# Patient Record
Sex: Female | Born: 1955 | Race: White | Hispanic: No | Marital: Married | Smoking: Never smoker
Health system: Southern US, Community
[De-identification: ages and names within clinical notes are randomized; demographics above are authoritative.]

## PROBLEM LIST (undated history)

## (undated) DIAGNOSIS — Z8489 Family history of other specified conditions: Secondary | ICD-10-CM

## (undated) DIAGNOSIS — K8000 Calculus of gallbladder with acute cholecystitis without obstruction: Secondary | ICD-10-CM

## (undated) DIAGNOSIS — R42 Dizziness and giddiness: Secondary | ICD-10-CM

## (undated) DIAGNOSIS — R21 Rash and other nonspecific skin eruption: Secondary | ICD-10-CM

## (undated) HISTORY — PX: BREAST LUMPECTOMY: SHX2

## (undated) HISTORY — PX: BUNIONECTOMY: SHX129

## (undated) HISTORY — DX: Dizziness and giddiness: R42

---

## 1998-12-08 ENCOUNTER — Other Ambulatory Visit: Admission: RE | Admit: 1998-12-08 | Discharge: 1998-12-08 | Payer: Self-pay | Admitting: *Deleted

## 1999-12-09 ENCOUNTER — Encounter: Admission: RE | Admit: 1999-12-09 | Discharge: 1999-12-09 | Payer: Self-pay | Admitting: *Deleted

## 1999-12-09 ENCOUNTER — Encounter: Payer: Self-pay | Admitting: *Deleted

## 2000-04-27 ENCOUNTER — Encounter: Admission: RE | Admit: 2000-04-27 | Discharge: 2000-04-27 | Payer: Self-pay | Admitting: Internal Medicine

## 2000-04-27 ENCOUNTER — Encounter: Payer: Self-pay | Admitting: Internal Medicine

## 2001-10-23 ENCOUNTER — Other Ambulatory Visit: Admission: RE | Admit: 2001-10-23 | Discharge: 2001-10-23 | Payer: Self-pay | Admitting: Obstetrics and Gynecology

## 2002-06-25 ENCOUNTER — Encounter: Admission: RE | Admit: 2002-06-25 | Discharge: 2002-06-25 | Payer: Self-pay | Admitting: Internal Medicine

## 2002-06-25 ENCOUNTER — Encounter: Payer: Self-pay | Admitting: Internal Medicine

## 2002-10-16 ENCOUNTER — Other Ambulatory Visit: Admission: RE | Admit: 2002-10-16 | Discharge: 2002-10-16 | Payer: Self-pay | Admitting: Obstetrics and Gynecology

## 2002-10-16 ENCOUNTER — Encounter: Payer: Self-pay | Admitting: Obstetrics and Gynecology

## 2002-10-16 ENCOUNTER — Encounter: Admission: RE | Admit: 2002-10-16 | Discharge: 2002-10-16 | Payer: Self-pay | Admitting: Obstetrics and Gynecology

## 2003-11-21 ENCOUNTER — Encounter: Admission: RE | Admit: 2003-11-21 | Discharge: 2003-11-21 | Payer: Self-pay | Admitting: Obstetrics and Gynecology

## 2004-06-04 ENCOUNTER — Encounter: Admission: RE | Admit: 2004-06-04 | Discharge: 2004-06-04 | Payer: Self-pay | Admitting: Obstetrics and Gynecology

## 2004-06-26 ENCOUNTER — Encounter (INDEPENDENT_AMBULATORY_CARE_PROVIDER_SITE_OTHER): Payer: Self-pay | Admitting: *Deleted

## 2004-06-26 ENCOUNTER — Ambulatory Visit (HOSPITAL_BASED_OUTPATIENT_CLINIC_OR_DEPARTMENT_OTHER): Admission: RE | Admit: 2004-06-26 | Discharge: 2004-06-26 | Payer: Self-pay | Admitting: Surgery

## 2004-06-26 ENCOUNTER — Ambulatory Visit (HOSPITAL_COMMUNITY): Admission: RE | Admit: 2004-06-26 | Discharge: 2004-06-26 | Payer: Self-pay | Admitting: Surgery

## 2004-11-24 ENCOUNTER — Encounter: Admission: RE | Admit: 2004-11-24 | Discharge: 2004-11-24 | Payer: Self-pay | Admitting: Obstetrics and Gynecology

## 2005-11-26 ENCOUNTER — Encounter: Admission: RE | Admit: 2005-11-26 | Discharge: 2005-11-26 | Payer: Self-pay | Admitting: Obstetrics and Gynecology

## 2006-02-12 ENCOUNTER — Emergency Department (HOSPITAL_COMMUNITY)
Admission: EM | Admit: 2006-02-12 | Discharge: 2006-02-12 | Payer: BLUE CROSS/BLUE SHIELD | Admitting: Emergency Medicine

## 2007-01-03 ENCOUNTER — Encounter: Admission: RE | Admit: 2007-01-03 | Discharge: 2007-01-03 | Payer: Self-pay | Admitting: Obstetrics and Gynecology

## 2010-07-03 NOTE — Op Note (Signed)
[name]                   ACCOUNT NO.:  [id number]   MEDICAL RECORD NO.:  [id number]          PATIENT TYPE:  AMB   LOCATION:  DSC                          FACILITY:  [facility]   PHYSICIAN:  Currie Paris, M.D.DATE OF BIRTH:  1955-06-04   DATE OF PROCEDURE:  06/26/2004  DATE OF DISCHARGE:                                 OPERATIVE REPORT   OFFICE MEDICAL RECORD NUMBER:  [id number]   PREOPERATIVE DIAGNOSIS:  Right breast mass, likely benign.   POSTOPERATIVE DIAGNOSIS:  Right breast mass, likely benign.   OPERATION:  Right breast biopsy.   SURGEON:  Currie Paris, M.D.   ANESTHESIA:  MAC.   CLINICAL HISTORY:  This is a 55 year old lady who presents with what she can  feel as a B.B.-sized mass and the right breast in about the 4 o'clock  position.  On exam, she had soft nodular density that felt more like a  lipoma or fatty density with some fibrous tissue.  An ultrasound and  mammogram failed to reveal any dominant mass.  The patient, however, was  quite [place] about this area and wished to have an excisional biopsy.   DESCRIPTION OF PROCEDURE:  The patient was seen in the holding area and had  no further questions.  She already marked the right breast as the operative  site and I did the same.   She was taken to the operating room and prior to being given any anesthesia,  the area in question was identified by the patient by palpation and  confirmed by me.  Again, this felt like benign nodular fat tissue, perhaps a  lipomatous-type change.   Once it had been identified and marked with her in the position for surgery,  she was given IV sedation and the breast was prepped and draped.  A time-out  then occurred.   I infiltrated 1% Xylocaine in a transverse fashion directly over the area in  question.  I made an incision.  Primarily, it was subcutaneous with some  fatty tissue with fibrous bands of breast tissue running through it.  I did  an excisional biopsy  going at least a centimeter in all directions around  this area including the deep, medial, lateral, inferior and superior.  There  was some nodular tissue present within this that I think represented what we  were feeling.   Careful palpation of the remaining breast tissue failed to reveal any other  abnormality, other than nodular fatty tissue.   Once I was sure everything was dry and we had infiltrated additional local,  the incision was closed in layers with 3-0 Vicryl followed by 4-0 Monocryl  subcuticular plus Dermabond.   The patient tolerated procedure well.  There were no operative complications  and all counts were correct.      CJS/MEDQ  D:  06/26/2004  T:  06/26/2004  Job:  [id number]   cc:   Lenoard Aden, M.D.  [address]  [place]  [place] [address]  Fax: [phone]

## 2013-07-31 ENCOUNTER — Encounter: Payer: Self-pay | Admitting: *Deleted

## 2014-03-15 ENCOUNTER — Encounter (HOSPITAL_COMMUNITY): Payer: Self-pay | Admitting: Emergency Medicine

## 2014-03-15 ENCOUNTER — Inpatient Hospital Stay (HOSPITAL_COMMUNITY)
Admission: EM | Admit: 2014-03-15 | Discharge: 2014-03-19 | DRG: 419 | Disposition: A | Discharge status: Home / Self Care | Payer: BLUE CROSS/BLUE SHIELD | Attending: Surgery | Admitting: Surgery

## 2014-03-15 DIAGNOSIS — K829 Disease of gallbladder, unspecified: Secondary | ICD-10-CM

## 2014-03-15 DIAGNOSIS — R21 Rash and other nonspecific skin eruption: Secondary | ICD-10-CM | POA: Diagnosis present

## 2014-03-15 DIAGNOSIS — K8020 Calculus of gallbladder without cholecystitis without obstruction: Secondary | ICD-10-CM

## 2014-03-15 DIAGNOSIS — E663 Overweight: Secondary | ICD-10-CM | POA: Diagnosis present

## 2014-03-15 DIAGNOSIS — K8000 Calculus of gallbladder with acute cholecystitis without obstruction: Secondary | ICD-10-CM | POA: Diagnosis present

## 2014-03-15 DIAGNOSIS — K8063 Calculus of gallbladder and bile duct with acute cholecystitis with obstruction: Principal | ICD-10-CM | POA: Diagnosis present

## 2014-03-15 DIAGNOSIS — Z6830 Body mass index (BMI) 30.0-30.9, adult: Secondary | ICD-10-CM

## 2014-03-15 DIAGNOSIS — Z8249 Family history of ischemic heart disease and other diseases of the circulatory system: Secondary | ICD-10-CM

## 2014-03-15 DIAGNOSIS — R1011 Right upper quadrant pain: Secondary | ICD-10-CM | POA: Diagnosis not present

## 2014-03-15 DIAGNOSIS — K831 Obstruction of bile duct: Secondary | ICD-10-CM | POA: Diagnosis present

## 2014-03-15 HISTORY — DX: Calculus of gallbladder with acute cholecystitis without obstruction: K80.00

## 2014-03-15 HISTORY — DX: Family history of other specified conditions: Z84.89

## 2014-03-15 HISTORY — DX: Rash and other nonspecific skin eruption: R21

## 2014-03-15 LAB — CBC WITH DIFFERENTIAL/PLATELET
BASOS PCT: 0 % (ref 0–1)
Basophils Absolute: 0 10*3/uL (ref 0.0–0.1)
EOS PCT: 1 % (ref 0–5)
Eosinophils Absolute: 0.1 10*3/uL (ref 0.0–0.7)
HEMATOCRIT: 45 % (ref 36.0–46.0)
HEMOGLOBIN: 15 g/dL (ref 12.0–15.0)
LYMPHS ABS: 1.1 10*3/uL (ref 0.7–4.0)
Lymphocytes Relative: 10 % — ABNORMAL LOW (ref 12–46)
MCH: 30.4 pg (ref 26.0–34.0)
MCHC: 33.3 g/dL (ref 30.0–36.0)
MCV: 91.1 fL (ref 78.0–100.0)
Monocytes Absolute: 0.8 10*3/uL (ref 0.1–1.0)
Monocytes Relative: 7 % (ref 3–12)
NEUTROS ABS: 8.7 10*3/uL — AB (ref 1.7–7.7)
NEUTROS PCT: 82 % — AB (ref 43–77)
Platelets: 297 10*3/uL (ref 150–400)
RBC: 4.94 MIL/uL (ref 3.87–5.11)
RDW: 13.7 % (ref 11.5–15.5)
WBC: 10.7 10*3/uL — ABNORMAL HIGH (ref 4.0–10.5)

## 2014-03-15 LAB — URINALYSIS, ROUTINE W REFLEX MICROSCOPIC
BILIRUBIN URINE: NEGATIVE
GLUCOSE, UA: NEGATIVE mg/dL
HGB URINE DIPSTICK: NEGATIVE
Ketones, ur: NEGATIVE mg/dL
Nitrite: NEGATIVE
PH: 7.5 (ref 5.0–8.0)
PROTEIN: NEGATIVE mg/dL
SPECIFIC GRAVITY, URINE: 1.017 (ref 1.005–1.030)
UROBILINOGEN UA: 0.2 mg/dL (ref 0.0–1.0)

## 2014-03-15 LAB — URINE MICROSCOPIC-ADD ON

## 2014-03-15 MED ORDER — ONDANSETRON HCL 4 MG/2ML IJ SOLN
4.0000 mg | Freq: Once | INTRAMUSCULAR | Status: AC
  Administered 2014-03-15: 4 mg via INTRAVENOUS
  Filled 2014-03-15: qty 2

## 2014-03-15 MED ORDER — HYDROMORPHONE HCL 1 MG/ML IJ SOLN
1.0000 mg | Freq: Once | INTRAMUSCULAR | Status: AC
  Administered 2014-03-15: 1 mg via INTRAVENOUS
  Filled 2014-03-15: qty 1

## 2014-03-15 NOTE — ED Notes (Signed)
Pt c/o R flank pain that radiates to mid back. Pain is colicky. Symptoms x one week. Rates pain 8/10. Pt denies hx of kidney stones. Pt ambulatory to exam room with steady gait.

## 2014-03-15 NOTE — ED Provider Notes (Signed)
CSN: [id number]     Arrival date & time 03/15/14  2142 History  This chart was scribed for Antonietta Breach, PA-C working with Kalman Drape, MD by Randa Evens, ED Scribe. This patient was seen in room WTR1/WLPT1 and the patient's care was started at 10:59 PM.    Chief Complaint  Patient presents with  . Abdominal Pain   Patient is a 59 y.o. female presenting with flank pain. The history is provided by the patient. No language interpreter was used.  Flank Pain Associated symptoms include abdominal pain and shortness of breath. Pertinent negatives include no chest pain.   HPI Comments: Ann Hill is a 59 y.o. female who presents to the Emergency Department complaining of intermittent RUQ abdominal pain onset 1 week prior. Pt states that the pain resolved after 4 days and now has returned. Pt states that tonight she felt SOB with the onset of pain. Pt states that the pain radiates to her back. Pt states she has tried changing her diet with no relief. Pt states that today after eating humus and pretzels the pain was worse. Pt denies any medications PTA other than benadryl tonight. Denies nausea, vomiting, diarrhea, hematuria, dysuria, fever or CP. PT denies family HX of gallstones or kidney stones.    Past Medical History  Diagnosis Date  . Dizziness and giddiness    Past Surgical History  Procedure Laterality Date  . Bunionectomy    . Breast lumpectomy      Bilateral   Family History  Problem Relation Age of Onset  . Hypertension Mother   . COPD Mother   . Cataracts Father   . Cancer Sister     brain(tumor)  . Heart disease Brother   . Lymphoma Brother    History  Substance Use Topics  . Smoking status: Not on file  . Smokeless tobacco: Not on file  . Alcohol Use: Not on file   OB History    No data available      Review of Systems  Constitutional: Negative for fever.  Respiratory: Positive for shortness of breath.   Cardiovascular: Negative for chest pain.   Gastrointestinal: Positive for abdominal pain. Negative for nausea, vomiting and diarrhea.  Genitourinary: Positive for flank pain. Negative for dysuria and hematuria.  Musculoskeletal: Positive for back pain.  All other systems reviewed and are negative.   Allergies  Morphine and related  Home Medications   Prior to Admission medications   Medication Sig Start Date End Date Taking? Authorizing Provider  diphenhydrAMINE (BENADRYL) 25 MG tablet Take 25 mg by mouth every 6 (six) hours as needed.   Yes Historical Provider, MD  loratadine (CLARITIN) 10 MG tablet Take 10 mg by mouth daily.   Yes Historical Provider, MD   BP 144/93 mmHg  Pulse 85  Temp(Src) 98 F (36.7 C) (Oral)  Resp 20  SpO2 100%   Physical Exam  Constitutional: She is oriented to person, place, and time. She appears well-developed and well-nourished. No distress.  Patient pacing the room, appears uncomfortable  HENT:  Head: Normocephalic and atraumatic.  Eyes: Conjunctivae and EOM are normal. No scleral icterus.  Neck: Normal range of motion.  Cardiovascular: Normal rate, regular rhythm and normal heart sounds.   Pulmonary/Chest: Effort normal and breath sounds normal. No respiratory distress. She has no wheezes. She has no rales.  Respirations even and unlabored  Abdominal: Soft. She exhibits no distension. There is tenderness. There is no rebound and no guarding.  Focal tenderness  in the right upper quadrant with a positive Murphy sign. Patient has no rebound tenderness. No involuntary guarding or peritoneal signs. No CVA tenderness.  Musculoskeletal: Normal range of motion.  Neurological: She is alert and oriented to person, place, and time. She exhibits normal muscle tone. Coordination normal.  GCS 15. Speech is goal oriented. Patient moves extremities without ataxia.  Skin: Skin is warm and dry. No rash noted. She is not diaphoretic. No erythema. No pallor.  Psychiatric: She has a normal mood and affect.  Her behavior is normal.  Nursing note and vitals reviewed.   ED Course  Procedures (including critical care time) DIAGNOSTIC STUDIES: Oxygen Saturation is 100% on RA, normal by my interpretation.    COORDINATION OF CARE: 11:06 PM-Discussed treatment plan with pt at bedside and pt agreed to plan.   Labs Review Labs Reviewed  URINALYSIS, ROUTINE W REFLEX MICROSCOPIC - Abnormal; Notable for the following:    Leukocytes, UA SMALL (*)    All other components within normal limits  CBC WITH DIFFERENTIAL/PLATELET - Abnormal; Notable for the following:    WBC 10.7 (*)    Neutrophils Relative % 82 (*)    Neutro Abs 8.7 (*)    Lymphocytes Relative 10 (*)    All other components within normal limits  COMPREHENSIVE METABOLIC PANEL - Abnormal; Notable for the following:    Glucose, Bld 112 (*)    AST 180 (*)    ALT 132 (*)    GFR calc non Af Amer 77 (*)    GFR calc Af Amer 90 (*)    All other components within normal limits  URINE MICROSCOPIC-ADD ON - Abnormal; Notable for the following:    Bacteria, UA FEW (*)    All other components within normal limits  LIPASE, BLOOD   Imaging Review US Abdomen Complete  03/16/2014   CLINICAL DATA:  Right upper quadrant pain  EXAM: ULTRASOUND ABDOMEN COMPLETE  COMPARISON:  None.  FINDINGS: Gallbladder: Cholelithiasis without pericholecystic fluid or gallbladder wall thickening. Ring down artifact from the gallbladder wall as can be seen with adenomyomatosis. Positive sonographic Murphy sign.  Common bile duct: Diameter: 3.6 mm  Liver: No focal lesion identified. Increased hepatic parenchymal echogenicity.  IVC: No abnormality visualized.  Pancreas: Visualized portion unremarkable.  Spleen: Size and appearance within normal limits.  Right Kidney: Length: 10.6 cm. Echogenicity within normal limits. No mass or hydronephrosis visualized.  Left Kidney: Length: 11.7 cm. Echogenicity within normal limits. No mass or hydronephrosis visualized.  Abdominal aorta: No  aneurysm visualized.  Other findings: None.  IMPRESSION: 1. Cholelithiasis without sonographic evidence of acute cholecystitis. 2. Increased hepatic parenchymal echogenicity as can be seen with hepatic steatosis.   Electronically Signed   By: Kathreen Devoid   On: 03/16/2014 01:03     EKG Interpretation None      MDM   Final diagnoses:  RUQ abdominal pain    59 year old female presents to the emergency department for further evaluation of right upper quadrant abdominal pain. This has been intermittent over the past week, but worsening with each attack. Patient found to have a mildly elevated white count with leukocytosis of 10.7. She also was noted to have elevated AST/ALT; bilirubin and alk phos are normal. Physical exam significant for a positive Murphy's sign. No peritoneal signs noted on exam.  Ultrasound shows cholelithiasis with a positive sonographic Murphy sign. No pericholecystic fluid or gallbladder wall thickening. Given laboratory workup and evidence of gallstones, general surgery consulted regarding this patient's care.  [facility] to admit for laparoscopic cholecystectomy.  I personally performed the services described in this documentation, which was scribed in my presence. The recorded information has been reviewed and is accurate.   Filed Vitals:   03/15/14 2159 03/16/14 0208  BP: 144/93 129/73  Pulse: 85 68  Temp: 98 F (36.7 C) 98.5 F (36.9 C)  TempSrc: Oral Oral  Resp: 20 18  SpO2: 100% 95%      Antonietta Breach, PA-C 03/16/14 0402  Kalman Drape, MD 03/16/14 [phone]

## 2014-03-16 ENCOUNTER — Encounter (HOSPITAL_COMMUNITY): Admission: EM | Disposition: A | Discharge status: Home / Self Care | Payer: Self-pay | Source: Home / Self Care

## 2014-03-16 ENCOUNTER — Emergency Department (HOSPITAL_COMMUNITY): Payer: BLUE CROSS/BLUE SHIELD

## 2014-03-16 ENCOUNTER — Encounter (HOSPITAL_COMMUNITY): Payer: Self-pay | Admitting: Surgery

## 2014-03-16 ENCOUNTER — Inpatient Hospital Stay (HOSPITAL_COMMUNITY): Payer: BLUE CROSS/BLUE SHIELD | Admitting: Certified Registered"

## 2014-03-16 ENCOUNTER — Inpatient Hospital Stay (HOSPITAL_COMMUNITY): Payer: BLUE CROSS/BLUE SHIELD

## 2014-03-16 DIAGNOSIS — R1011 Right upper quadrant pain: Secondary | ICD-10-CM | POA: Diagnosis present

## 2014-03-16 DIAGNOSIS — K8063 Calculus of gallbladder and bile duct with acute cholecystitis with obstruction: Secondary | ICD-10-CM | POA: Diagnosis present

## 2014-03-16 DIAGNOSIS — R21 Rash and other nonspecific skin eruption: Secondary | ICD-10-CM | POA: Diagnosis present

## 2014-03-16 DIAGNOSIS — K831 Obstruction of bile duct: Secondary | ICD-10-CM | POA: Diagnosis present

## 2014-03-16 DIAGNOSIS — K8000 Calculus of gallbladder with acute cholecystitis without obstruction: Secondary | ICD-10-CM

## 2014-03-16 DIAGNOSIS — E663 Overweight: Secondary | ICD-10-CM | POA: Diagnosis present

## 2014-03-16 DIAGNOSIS — Z6830 Body mass index (BMI) 30.0-30.9, adult: Secondary | ICD-10-CM | POA: Diagnosis not present

## 2014-03-16 DIAGNOSIS — Z8249 Family history of ischemic heart disease and other diseases of the circulatory system: Secondary | ICD-10-CM | POA: Diagnosis not present

## 2014-03-16 HISTORY — DX: Calculus of gallbladder with acute cholecystitis without obstruction: K80.00

## 2014-03-16 HISTORY — PX: LAPAROSCOPIC CHOLECYSTECTOMY SINGLE SITE WITH INTRAOPERATIVE CHOLANGIOGRAM: SHX6538

## 2014-03-16 HISTORY — PX: CHOLECYSTECTOMY: SHX55

## 2014-03-16 HISTORY — DX: Rash and other nonspecific skin eruption: R21

## 2014-03-16 LAB — SURGICAL PCR SCREEN
MRSA, PCR: NEGATIVE
Staphylococcus aureus: NEGATIVE

## 2014-03-16 LAB — LIPASE, BLOOD: Lipase: 51 U/L (ref 11–59)

## 2014-03-16 LAB — COMPREHENSIVE METABOLIC PANEL
ALBUMIN: 4.2 g/dL (ref 3.5–5.2)
ALT: 132 U/L — ABNORMAL HIGH (ref 0–35)
AST: 180 U/L — ABNORMAL HIGH (ref 0–37)
Alkaline Phosphatase: 66 U/L (ref 39–117)
Anion gap: 8 (ref 5–15)
BILIRUBIN TOTAL: 0.7 mg/dL (ref 0.3–1.2)
BUN: 16 mg/dL (ref 6–23)
CALCIUM: 9.4 mg/dL (ref 8.4–10.5)
CO2: 26 mmol/L (ref 19–32)
CREATININE: 0.83 mg/dL (ref 0.50–1.10)
Chloride: 104 mmol/L (ref 96–112)
GFR, EST AFRICAN AMERICAN: 90 mL/min — AB (ref >90)
GFR, EST NON AFRICAN AMERICAN: 77 mL/min — AB (ref >90)
GLUCOSE: 112 mg/dL — AB (ref 70–99)
Potassium: 3.8 mmol/L (ref 3.5–5.1)
SODIUM: 138 mmol/L (ref 135–145)
Total Protein: 6.7 g/dL (ref 6.0–8.3)

## 2014-03-16 SURGERY — LAPAROSCOPIC CHOLECYSTECTOMY WITH INTRAOPERATIVE CHOLANGIOGRAM
Anesthesia: General | Site: Abdomen

## 2014-03-16 SURGERY — LAPAROSCOPIC CHOLECYSTECTOMY WITH INTRAOPERATIVE CHOLANGIOGRAM
Anesthesia: General

## 2014-03-16 MED ORDER — FENTANYL CITRATE 0.05 MG/ML IJ SOLN
INTRAMUSCULAR | Status: AC
  Filled 2014-03-16: qty 5

## 2014-03-16 MED ORDER — LIDOCAINE HCL (CARDIAC) 20 MG/ML IV SOLN
INTRAVENOUS | Status: DC | PRN
  Administered 2014-03-16: 50 mg via INTRAVENOUS

## 2014-03-16 MED ORDER — IOHEXOL 300 MG/ML  SOLN
INTRAMUSCULAR | Status: DC | PRN
  Administered 2014-03-16: 10 mL

## 2014-03-16 MED ORDER — HYDROMORPHONE HCL 1 MG/ML IJ SOLN
INTRAMUSCULAR | Status: AC
  Filled 2014-03-16: qty 1

## 2014-03-16 MED ORDER — LIDOCAINE HCL (CARDIAC) 20 MG/ML IV SOLN
INTRAVENOUS | Status: AC
  Filled 2014-03-16: qty 5

## 2014-03-16 MED ORDER — STERILE WATER FOR IRRIGATION IR SOLN
Status: DC | PRN
  Administered 2014-03-16: 1500 mL

## 2014-03-16 MED ORDER — BUPIVACAINE-EPINEPHRINE 0.25% -1:200000 IJ SOLN
INTRAMUSCULAR | Status: AC
  Filled 2014-03-16: qty 1

## 2014-03-16 MED ORDER — LACTATED RINGERS IV SOLN: INTRAVENOUS | Status: DC

## 2014-03-16 MED ORDER — CHLORHEXIDINE GLUCONATE 4 % EX LIQD: 1.0000 "application " | Freq: Once | CUTANEOUS | Status: DC

## 2014-03-16 MED ORDER — CEFTRIAXONE SODIUM 2 G IJ SOLR
2.0000 g | INTRAMUSCULAR | Status: DC
  Administered 2014-03-16 – 2014-03-19 (×4): 2 g via INTRAVENOUS
  Filled 2014-03-16 (×4): qty 2

## 2014-03-16 MED ORDER — KCL IN DEXTROSE-NACL 40-5-0.45 MEQ/L-%-% IV SOLN
INTRAVENOUS | Status: DC
  Administered 2014-03-16 – 2014-03-17 (×3): via INTRAVENOUS
  Filled 2014-03-16 (×3): qty 1000

## 2014-03-16 MED ORDER — HYDROMORPHONE HCL 1 MG/ML IJ SOLN
INTRAMUSCULAR | Status: AC
  Filled 2014-03-16: qty 2

## 2014-03-16 MED ORDER — LACTATED RINGERS IV BOLUS (SEPSIS): 1000.0000 mL | Freq: Three times a day (TID) | INTRAVENOUS | Status: AC | PRN

## 2014-03-16 MED ORDER — PROPOFOL 10 MG/ML IV BOLUS
INTRAVENOUS | Status: DC | PRN
Start: 2014-03-16 — End: 2014-03-16
  Administered 2014-03-16: 180 mg via INTRAVENOUS

## 2014-03-16 MED ORDER — LACTATED RINGERS IV BOLUS (SEPSIS): 1000.0000 mL | Freq: Once | INTRAVENOUS | Status: DC

## 2014-03-16 MED ORDER — BUPIVACAINE-EPINEPHRINE 0.25% -1:200000 IJ SOLN
INTRAMUSCULAR | Status: DC | PRN
  Administered 2014-03-16: 50 mL

## 2014-03-16 MED ORDER — OXYCODONE HCL 5 MG PO TABS
5.0000 mg | ORAL_TABLET | ORAL | Status: DC | PRN
  Administered 2014-03-17 (×2): 5 mg via ORAL
  Administered 2014-03-18: 10 mg via ORAL
  Administered 2014-03-18: 5 mg via ORAL
  Administered 2014-03-19: 10 mg via ORAL
  Filled 2014-03-16 (×3): qty 1
  Filled 2014-03-16 (×2): qty 2

## 2014-03-16 MED ORDER — LORATADINE 10 MG PO TABS
10.0000 mg | ORAL_TABLET | Freq: Every day | ORAL | Status: DC
  Administered 2014-03-17 – 2014-03-18 (×2): 10 mg via ORAL
  Filled 2014-03-16 (×4): qty 1

## 2014-03-16 MED ORDER — LIP MEDEX EX OINT
TOPICAL_OINTMENT | CUTANEOUS | Status: AC
  Filled 2014-03-16: qty 7

## 2014-03-16 MED ORDER — DEXAMETHASONE SODIUM PHOSPHATE 10 MG/ML IJ SOLN
INTRAMUSCULAR | Status: DC | PRN
  Administered 2014-03-16: 10 mg via INTRAVENOUS

## 2014-03-16 MED ORDER — BISACODYL 10 MG RE SUPP: 10.0000 mg | Freq: Two times a day (BID) | RECTAL | Status: DC | PRN

## 2014-03-16 MED ORDER — 0.9 % SODIUM CHLORIDE (POUR BTL) OPTIME
TOPICAL | Status: DC | PRN
  Administered 2014-03-16: 1000 mL

## 2014-03-16 MED ORDER — ONDANSETRON HCL 4 MG/2ML IJ SOLN
INTRAMUSCULAR | Status: AC
  Filled 2014-03-16: qty 2

## 2014-03-16 MED ORDER — KETOROLAC TROMETHAMINE 30 MG/ML IJ SOLN
INTRAMUSCULAR | Status: AC
  Filled 2014-03-16: qty 1

## 2014-03-16 MED ORDER — HYDROMORPHONE HCL 1 MG/ML IJ SOLN
0.2500 mg | INTRAMUSCULAR | Status: DC | PRN
  Administered 2014-03-16: 0.5 mg via INTRAVENOUS

## 2014-03-16 MED ORDER — ROCURONIUM BROMIDE 100 MG/10ML IV SOLN
INTRAVENOUS | Status: DC | PRN
  Administered 2014-03-16: 5 mg via INTRAVENOUS
  Administered 2014-03-16: 25 mg via INTRAVENOUS

## 2014-03-16 MED ORDER — LACTATED RINGERS IR SOLN
Status: DC | PRN
  Administered 2014-03-16: 1

## 2014-03-16 MED ORDER — SODIUM CHLORIDE 0.9 % IJ SOLN
INTRAMUSCULAR | Status: AC
  Filled 2014-03-16: qty 10

## 2014-03-16 MED ORDER — DIPHENHYDRAMINE HCL 50 MG/ML IJ SOLN
12.5000 mg | Freq: Four times a day (QID) | INTRAMUSCULAR | Status: DC | PRN
  Administered 2014-03-16 – 2014-03-17 (×2): 25 mg via INTRAVENOUS
  Filled 2014-03-16 (×2): qty 1

## 2014-03-16 MED ORDER — ACETAMINOPHEN 325 MG PO TABS: 650.0000 mg | ORAL_TABLET | Freq: Four times a day (QID) | ORAL | Status: DC | PRN

## 2014-03-16 MED ORDER — MIDAZOLAM HCL 5 MG/5ML IJ SOLN
INTRAMUSCULAR | Status: DC | PRN
  Administered 2014-03-16: 2 mg via INTRAVENOUS

## 2014-03-16 MED ORDER — ZOLPIDEM TARTRATE 5 MG PO TABS: 5.0000 mg | ORAL_TABLET | Freq: Every evening | ORAL | Status: DC | PRN

## 2014-03-16 MED ORDER — PROPOFOL 10 MG/ML IV BOLUS
INTRAVENOUS | Status: AC
  Filled 2014-03-16: qty 20

## 2014-03-16 MED ORDER — FENTANYL CITRATE 0.05 MG/ML IJ SOLN
INTRAMUSCULAR | Status: DC | PRN
  Administered 2014-03-16: 50 ug via INTRAVENOUS
  Administered 2014-03-16: 100 ug via INTRAVENOUS
  Administered 2014-03-16 (×2): 50 ug via INTRAVENOUS

## 2014-03-16 MED ORDER — PROMETHAZINE HCL 25 MG/ML IJ SOLN
6.2500 mg | INTRAMUSCULAR | Status: DC | PRN
Start: 2014-03-16 — End: 2014-03-16

## 2014-03-16 MED ORDER — GLYCOPYRROLATE 0.2 MG/ML IJ SOLN
INTRAMUSCULAR | Status: AC
  Filled 2014-03-16: qty 3

## 2014-03-16 MED ORDER — ACETAMINOPHEN 650 MG RE SUPP: 650.0000 mg | Freq: Four times a day (QID) | RECTAL | Status: DC | PRN

## 2014-03-16 MED ORDER — POLYETHYLENE GLYCOL 3350 17 G PO PACK
17.0000 g | PACK | Freq: Two times a day (BID) | ORAL | Status: DC | PRN
  Filled 2014-03-16: qty 1

## 2014-03-16 MED ORDER — SACCHAROMYCES BOULARDII 250 MG PO CAPS
250.0000 mg | ORAL_CAPSULE | Freq: Two times a day (BID) | ORAL | Status: DC
  Administered 2014-03-17 – 2014-03-19 (×5): 250 mg via ORAL
  Filled 2014-03-16 (×8): qty 1

## 2014-03-16 MED ORDER — ONDANSETRON HCL 4 MG/2ML IJ SOLN
4.0000 mg | Freq: Four times a day (QID) | INTRAMUSCULAR | Status: DC | PRN
  Administered 2014-03-16 – 2014-03-17 (×5): 4 mg via INTRAVENOUS
  Filled 2014-03-16 (×5): qty 2

## 2014-03-16 MED ORDER — ONDANSETRON HCL 4 MG/2ML IJ SOLN
INTRAMUSCULAR | Status: DC | PRN
  Administered 2014-03-16: 4 mg via INTRAVENOUS

## 2014-03-16 MED ORDER — HYDROMORPHONE HCL 1 MG/ML IJ SOLN
0.5000 mg | INTRAMUSCULAR | Status: DC | PRN
  Administered 2014-03-16 (×4): 1 mg via INTRAVENOUS
  Administered 2014-03-16 – 2014-03-18 (×7): 2 mg via INTRAVENOUS
  Administered 2014-03-18: 1 mg via INTRAVENOUS
  Filled 2014-03-16 (×4): qty 2
  Filled 2014-03-16: qty 1
  Filled 2014-03-16 (×2): qty 2
  Filled 2014-03-16 (×3): qty 1
  Filled 2014-03-16 (×2): qty 2
  Filled 2014-03-16: qty 1

## 2014-03-16 MED ORDER — DEXAMETHASONE SODIUM PHOSPHATE 10 MG/ML IJ SOLN
INTRAMUSCULAR | Status: AC
  Filled 2014-03-16: qty 1

## 2014-03-16 MED ORDER — PHENOL 1.4 % MT LIQD
2.0000 | OROMUCOSAL | Status: DC | PRN
  Filled 2014-03-16: qty 177

## 2014-03-16 MED ORDER — GLYCOPYRROLATE 0.2 MG/ML IJ SOLN
INTRAMUSCULAR | Status: DC | PRN
Start: 2014-03-16 — End: 2014-03-16
  Administered 2014-03-16: 0.6 mg via INTRAVENOUS

## 2014-03-16 MED ORDER — NEOSTIGMINE METHYLSULFATE 10 MG/10ML IV SOLN
INTRAVENOUS | Status: AC
  Filled 2014-03-16: qty 1

## 2014-03-16 MED ORDER — DIPHENHYDRAMINE HCL 12.5 MG/5ML PO ELIX
12.5000 mg | ORAL_SOLUTION | Freq: Four times a day (QID) | ORAL | Status: DC | PRN
  Administered 2014-03-17: 25 mg via ORAL
  Filled 2014-03-16: qty 10

## 2014-03-16 MED ORDER — PHENYLEPHRINE HCL 10 MG/ML IJ SOLN
INTRAMUSCULAR | Status: DC | PRN
  Administered 2014-03-16 (×3): 80 ug via INTRAVENOUS

## 2014-03-16 MED ORDER — OXYCODONE HCL 5 MG PO TABS: 5.0000 mg | ORAL_TABLET | ORAL | Status: DC | PRN

## 2014-03-16 MED ORDER — LIP MEDEX EX OINT
1.0000 "application " | TOPICAL_OINTMENT | Freq: Two times a day (BID) | CUTANEOUS | Status: DC
  Administered 2014-03-16 – 2014-03-19 (×7): 1 via TOPICAL
  Filled 2014-03-16: qty 7

## 2014-03-16 MED ORDER — METOPROLOL TARTRATE 1 MG/ML IV SOLN
5.0000 mg | Freq: Four times a day (QID) | INTRAVENOUS | Status: DC | PRN
  Filled 2014-03-16: qty 5

## 2014-03-16 MED ORDER — METHOCARBAMOL 1000 MG/10ML IJ SOLN
1000.0000 mg | Freq: Four times a day (QID) | INTRAVENOUS | Status: DC | PRN
  Administered 2014-03-16 (×2): 1000 mg via INTRAVENOUS
  Filled 2014-03-16 (×5): qty 10

## 2014-03-16 MED ORDER — LACTATED RINGERS IV SOLN
INTRAVENOUS | Status: DC | PRN
  Administered 2014-03-16 (×2): via INTRAVENOUS

## 2014-03-16 MED ORDER — PROMETHAZINE HCL 25 MG/ML IJ SOLN
6.2500 mg | INTRAMUSCULAR | Status: DC | PRN
  Filled 2014-03-16: qty 1

## 2014-03-16 MED ORDER — PROMETHAZINE HCL 25 MG/ML IJ SOLN
INTRAMUSCULAR | Status: AC
  Filled 2014-03-16: qty 1

## 2014-03-16 MED ORDER — SUCCINYLCHOLINE CHLORIDE 20 MG/ML IJ SOLN
INTRAMUSCULAR | Status: DC | PRN
  Administered 2014-03-16: 100 mg via INTRAVENOUS

## 2014-03-16 MED ORDER — ALUM & MAG HYDROXIDE-SIMETH 200-200-20 MG/5ML PO SUSP
30.0000 mL | Freq: Four times a day (QID) | ORAL | Status: DC | PRN
  Administered 2014-03-16: 30 mL via ORAL
  Filled 2014-03-16: qty 30

## 2014-03-16 MED ORDER — MIDAZOLAM HCL 2 MG/2ML IJ SOLN
INTRAMUSCULAR | Status: AC
Start: 2014-03-16 — End: 2014-03-16
  Filled 2014-03-16: qty 2

## 2014-03-16 MED ORDER — HEPARIN SODIUM (PORCINE) 5000 UNIT/ML IJ SOLN
5000.0000 [IU] | Freq: Three times a day (TID) | INTRAMUSCULAR | Status: DC
  Administered 2014-03-17 – 2014-03-19 (×7): 5000 [IU] via SUBCUTANEOUS
  Filled 2014-03-16 (×10): qty 1

## 2014-03-16 MED ORDER — MAGIC MOUTHWASH
15.0000 mL | Freq: Four times a day (QID) | ORAL | Status: DC | PRN
  Filled 2014-03-16: qty 15

## 2014-03-16 MED ORDER — HYDROMORPHONE HCL 1 MG/ML IJ SOLN
0.2500 mg | INTRAMUSCULAR | Status: DC | PRN
  Administered 2014-03-16 (×6): 0.5 mg via INTRAVENOUS

## 2014-03-16 MED ORDER — IBUPROFEN 200 MG PO TABS: 400.0000 mg | ORAL_TABLET | Freq: Four times a day (QID) | ORAL | Status: DC | PRN

## 2014-03-16 MED ORDER — KETOROLAC TROMETHAMINE 30 MG/ML IJ SOLN
INTRAMUSCULAR | Status: DC | PRN
  Administered 2014-03-16: 30 mg via INTRAVENOUS

## 2014-03-16 MED ORDER — ROCURONIUM BROMIDE 100 MG/10ML IV SOLN
INTRAVENOUS | Status: AC
  Filled 2014-03-16: qty 1

## 2014-03-16 MED ORDER — MEPERIDINE HCL 50 MG/ML IJ SOLN: 6.2500 mg | INTRAMUSCULAR | Status: DC | PRN

## 2014-03-16 MED ORDER — METOPROLOL TARTRATE 12.5 MG HALF TABLET
12.5000 mg | ORAL_TABLET | Freq: Two times a day (BID) | ORAL | Status: DC | PRN
  Filled 2014-03-16: qty 1

## 2014-03-16 MED ORDER — NEOSTIGMINE METHYLSULFATE 10 MG/10ML IV SOLN
INTRAVENOUS | Status: DC | PRN
  Administered 2014-03-16: 4 mg via INTRAVENOUS

## 2014-03-16 MED ORDER — MENTHOL 3 MG MT LOZG
1.0000 | LOZENGE | OROMUCOSAL | Status: DC | PRN
  Filled 2014-03-16: qty 9

## 2014-03-16 SURGICAL SUPPLY — 37 items
APPLIER CLIP 5 13 M/L LIGAMAX5 (MISCELLANEOUS) ×2
APPLIER CLIP ROT 10 11.4 M/L (STAPLE)
CABLE HIGH FREQUENCY MONO STRZ (ELECTRODE) ×2 IMPLANT
CLIP APPLIE 5 13 M/L LIGAMAX5 (MISCELLANEOUS) ×1 IMPLANT
CLIP APPLIE ROT 10 11.4 M/L (STAPLE) IMPLANT
COVER MAYO STAND STRL (DRAPES) ×2 IMPLANT
DECANTER SPIKE VIAL GLASS SM (MISCELLANEOUS) ×2 IMPLANT
DRAPE C-ARM 42X120 X-RAY (DRAPES) ×2 IMPLANT
DRAPE LAPAROSCOPIC ABDOMINAL (DRAPES) ×2 IMPLANT
DRAPE UTILITY XL STRL (DRAPES) ×2 IMPLANT
DRAPE WARM FLUID 44X44 (DRAPE) ×2 IMPLANT
DRSG TEGADERM 2-3/8X2-3/4 SM (GAUZE/BANDAGES/DRESSINGS) ×6 IMPLANT
DRSG TEGADERM 4X4.75 (GAUZE/BANDAGES/DRESSINGS) ×2 IMPLANT
ELECT REM PT RETURN 9FT ADLT (ELECTROSURGICAL) ×2
ELECTRODE REM PT RTRN 9FT ADLT (ELECTROSURGICAL) ×1 IMPLANT
ENDOLOOP SUT PDS II  0 18 (SUTURE) ×1
ENDOLOOP SUT PDS II 0 18 (SUTURE) ×1 IMPLANT
GAUZE SPONGE 2X2 8PLY STRL LF (GAUZE/BANDAGES/DRESSINGS) ×1 IMPLANT
GAUZE SPONGE 4X4 12PLY STRL (GAUZE/BANDAGES/DRESSINGS) ×2 IMPLANT
GLOVE ECLIPSE 8.0 STRL XLNG CF (GLOVE) ×2 IMPLANT
GLOVE INDICATOR 8.0 STRL GRN (GLOVE) ×2 IMPLANT
GOWN STRL REUS W/TWL XL LVL3 (GOWN DISPOSABLE) ×4 IMPLANT
KIT BASIN OR (CUSTOM PROCEDURE TRAY) ×2 IMPLANT
POUCH SPECIMEN RETRIEVAL 10MM (ENDOMECHANICALS) ×2 IMPLANT
SCISSORS LAP 5X35 DISP (ENDOMECHANICALS) ×2 IMPLANT
SET CHOLANGIOGRAPH MIX (MISCELLANEOUS) ×2 IMPLANT
SET IRRIG TUBING LAPAROSCOPIC (IRRIGATION / IRRIGATOR) ×2 IMPLANT
SLEEVE XCEL OPT CAN 5 100 (ENDOMECHANICALS) ×2 IMPLANT
SPONGE GAUZE 2X2 STER 10/PKG (GAUZE/BANDAGES/DRESSINGS) ×1
SUT MNCRL AB 4-0 PS2 18 (SUTURE) ×2 IMPLANT
SUT PDS AB 0 CT1 36 (SUTURE) ×2 IMPLANT
SYR 20CC LL (SYRINGE) ×2 IMPLANT
TOWEL OR 17X26 10 PK STRL BLUE (TOWEL DISPOSABLE) ×2 IMPLANT
TOWEL OR NON WOVEN STRL DISP B (DISPOSABLE) ×2 IMPLANT
TRAY LAPAROSCOPIC (CUSTOM PROCEDURE TRAY) ×2 IMPLANT
TROCAR BLADELESS OPT 5 100 (ENDOMECHANICALS) ×2 IMPLANT
TROCAR XCEL NON-BLD 11X100MML (ENDOMECHANICALS) ×2 IMPLANT

## 2014-03-16 NOTE — Transfer of Care (Signed)
Immediate Anesthesia Transfer of Care Note  Patient: Ann Hill  Procedure(s) Performed: Procedure(s): SINGLE SITE LAPAROSCOPIC CHOLECYSTECTOMY WITH INTRAOPERATIVE CHOLANGIOGRAM (N/A)  Patient Location: PACU  Anesthesia Type:General  Level of Consciousness: awake, alert  and oriented  Airway & Oxygen Therapy: Patient Spontanous Breathing and Patient connected to face mask oxygen  Post-op Assessment: Report given to RN and Post -op Vital signs reviewed and stable  Post vital signs: Reviewed and stable  Last Vitals:  Filed Vitals:   03/16/14 0639  BP: 142/89  Pulse: 79  Temp: 36.9 C  Resp: 18    Complications: No apparent anesthesia complications

## 2014-03-16 NOTE — H&P (Signed)
Ann  Hill[address] [place], [place] [address] Phone: [phone] FAX: [phone]     Ann Hill  Sep 22, 1955 [id number]  CARE TEAM:  PCP: No primary care provider on file.  Outpatient Care Team: No care team member to display  Inpatient Treatment Team: Treatment Team: Attending Provider: Kalman Drape, MD; Physician Assistant: Antonietta Breach, PA-C; Technician: Crist Infante, NT; Registered Nurse: Loel Ro, RN  This patient is a 59 y.o.female who presents today for surgical evaluation at the request of Antonietta Breach, [place], [facility] ED.   Reason for evaluation: Cholecystitis  Pleasant overweight female that is struggled with intermittent attacks of abdominal pain.  Worse attacks of woke her up in the middle the night.  She thinks is related to eating.  Most attacks within the past week.  Took some antiacid meds with one attack.  That initially seem to help.  However she got attack again.  She did a lot of research herself.  She is concerned it may be her gallbladder.  She had been trying to switch to a low-fat diet. Started having some pain and early satiety after having some hummus and pretzels yesterday afternoon.  Then a couple hours after eating her meal pain became more intense.  She had salad with cottage cheese and chicken.  Has been focused in the right upper quadrant.  Radiates to her back.  No major nausea or vomiting.  Some fullness.   She came to emergency room 5 hours ago when the pain persisted.  Initial physical exam showed Murphy sign.  Improved after getting some IV pain medication narcotics.  Pain is down but not totally gone.  Sonographic Murphy sign with gallstones.  Suspicious for early cholecystitis.  She is moderately active.  She can walk half hour without difficulty.  No abdominal surgeries.  No personal nor family history of GI/colon cancer, inflammatory bowel disease, irritable bowel syndrome, allergy such as  Celiac Sprue, dietary/dairy problems, colitis, ulcers nor gastritis.  No recent sick contacts/gastroenteritis.  No travel outside the country.  No changes in diet.  No dysphagia to solids or liquids.  No significant heartburn or reflux.  No hematochezia, hematemesis, coffee ground emesis.  No evidence of prior gastric/peptic ulceration.  She has never had a colonoscopy.  She says she will never get one for personal reasons.  Apparently her husband had problems with one.    Past Medical History  Diagnosis Date  . Dizziness and giddiness     Past Surgical History  Procedure Laterality Date  . Bunionectomy    . Breast lumpectomy      Bilateral    History   Social History  . Marital Status: Married    Spouse Name: N/A    Number of Children: N/A  . Years of Education: N/A   Occupational History  . Not on file.   Social History Main Topics  . Smoking status: Not on file  . Smokeless tobacco: Not on file  . Alcohol Use: Not on file  . Drug Use: Not on file  . Sexual Activity: Not on file   Other Topics Concern  . Not on file   Social History Narrative    Family History  Problem Relation Age of Onset  . Hypertension Mother   . COPD Mother   . Cataracts Father   . Cancer Sister     brain(tumor)  . Heart disease Brother   . Lymphoma Brother  No current facility-administered medications for this encounter.   Current Outpatient Prescriptions  Medication Sig Dispense Refill  . diphenhydrAMINE (BENADRYL) 25 MG tablet Take 25 mg by mouth every 6 (six) hours as needed.    . loratadine (CLARITIN) 10 MG tablet Take 10 mg by mouth daily.       Allergies  Allergen Reactions  . Morphine And Related     itching    ROS: Constitutional:  No fevers, chills, sweats.  Weight stable Eyes:  No vision changes, No discharge HENT:  No sore throats, nasal drainage Lymph: No neck swelling, No bruising easily Pulmonary:  No cough, productive sputum CV: No orthopnea, PND   Patient walks 30 minutes.  No exertional chest/neck/shoulder/arm pain. GI:  No personal nor family history of GI/colon cancer, inflammatory bowel disease, irritable bowel syndrome, allergy such as Celiac Sprue, dietary/dairy problems, colitis, ulcers nor gastritis.  No recent sick contacts/gastroenteritis.  No travel outside the country.  No changes in diet. Renal: No UTIs, No hematuria Genital:  No drainage, bleeding, masses Musculoskeletal: No severe joint pain.  Good ROM major joints Skin:  No sores or lesions.  No rashes Heme/Lymph:  No easy bleeding.  No swollen lymph nodes Neuro: No focal weakness/numbness.  No seizures Psych: No suicidal ideation.  No hallucinations  BP 129/73 mmHg  Pulse 68  Temp(Src) 98.5 F (36.9 C) (Oral)  Resp 18  SpO2 95%  Physical Exam: General: Pt awake/alert/oriented x4 in  no major acute distress Eyes: PERRL, normal EOM. Sclera nonicteric Neuro: CN II-XII intact w/o focal sensory/motor deficits. Lymph: No head/neck/groin lymphadenopathy Psych:  No delerium/psychosis/paranoia HENT: Normocephalic, Mucus membranes moist.  No thrush Neck: Supple, No tracheal deviation Chest: No pain.  Good respiratory excursion. CV:  Pulses intact.  Regular rhythm Abdomen: Soft, Obese.  Nondistended.  Mild/mod TTP RUQ.  No severe Murphy sign.  No incarcerated hernias. Ext:  SCDs BLE.  No significant edema.  No cyanosis Skin: No petechiae / purpurea.  No major sores Musculoskeletal: No severe joint pain.  Good ROM major joints   Results:   Labs: Results for orders placed or performed during the hospital encounter of 03/15/14 (from the past 48 hour(s))  Urinalysis, Routine w reflex microscopic     Status: Abnormal   Collection Time: 03/15/14 10:27 PM  Result Value Ref Range   Color, Urine YELLOW YELLOW   APPearance [place]   Specific Gravity, Urine 1.017 1.005 - 1.030   pH 7.5 5.0 - 8.0   Glucose, UA NEGATIVE NEGATIVE mg/dL   Hgb urine dipstick NEGATIVE  NEGATIVE   Bilirubin Urine NEGATIVE NEGATIVE   Ketones, ur NEGATIVE NEGATIVE mg/dL   Protein, ur NEGATIVE NEGATIVE mg/dL   Urobilinogen, UA 0.2 0.0 - 1.0 mg/dL   Nitrite NEGATIVE NEGATIVE   Leukocytes, UA SMALL (A) NEGATIVE  Urine microscopic-add on     Status: Abnormal   Collection Time: 03/15/14 10:27 PM  Result Value Ref Range   Squamous Epithelial / LPF RARE RARE   WBC, UA 3-6 <3 WBC/hpf   RBC / HPF 0-2 <3 RBC/hpf   Bacteria, UA FEW (A) RARE  CBC with Differential     Status: Abnormal   Collection Time: 03/15/14 11:21 PM  Result Value Ref Range   WBC 10.7 (H) 4.0 - 10.5 K/uL   RBC 4.94 3.87 - 5.11 MIL/uL   Hemoglobin 15.0 12.0 - 15.0 g/dL   HCT 45.0 36.0 - 46.0 %   MCV 91.1 78.0 - 100.0 fL   MCH  30.4 26.0 - 34.0 pg   MCHC 33.3 30.0 - 36.0 g/dL   RDW 13.7 11.5 - 15.5 %   Platelets 297 150 - 400 K/uL   Neutrophils Relative % 82 (H) 43 - 77 %   Neutro Abs 8.7 (H) 1.7 - 7.7 K/uL   Lymphocytes Relative 10 (L) 12 - 46 %   Lymphs Abs 1.1 0.7 - 4.0 K/uL   Monocytes Relative 7 3 - 12 %   Monocytes Absolute 0.8 0.1 - 1.0 K/uL   Eosinophils Relative 1 0 - 5 %   Eosinophils Absolute 0.1 0.0 - 0.7 K/uL   Basophils Relative 0 0 - 1 %   Basophils Absolute 0.0 0.0 - 0.1 K/uL  Comprehensive metabolic panel     Status: Abnormal   Collection Time: 03/15/14 11:22 PM  Result Value Ref Range   Sodium 138 135 - 145 mmol/L   Potassium 3.8 3.5 - 5.1 mmol/L   Chloride 104 96 - 112 mmol/L   CO2 26 19 - 32 mmol/L   Glucose, Bld 112 (H) 70 - 99 mg/dL   BUN 16 6 - 23 mg/dL   Creatinine, Ser 0.83 0.50 - 1.10 mg/dL   Calcium 9.4 8.4 - 10.5 mg/dL   Total Protein 6.7 6.0 - 8.3 g/dL   Albumin 4.2 3.5 - 5.2 g/dL   AST 180 (H) 0 - 37 U/L   ALT 132 (H) 0 - 35 U/L   Alkaline Phosphatase 66 39 - 117 U/L   Total Bilirubin 0.7 0.3 - 1.2 mg/dL   GFR calc non Af Amer 77 (L) >90 mL/min   GFR calc Af Amer 90 (L) >90 mL/min    Comment: (NOTE) The eGFR has been calculated using the CKD EPI  equation. This calculation has not been validated in all clinical situations. eGFR's persistently <90 mL/min signify possible Chronic Kidney Disease.    Anion gap 8 5 - 15  Lipase, blood     Status: None   Collection Time: 03/15/14 11:22 PM  Result Value Ref Range   Lipase 51 11 - 59 U/L    Imaging / Studies: US Abdomen Complete  03/16/2014   CLINICAL DATA:  Right upper quadrant pain  EXAM: ULTRASOUND ABDOMEN COMPLETE  COMPARISON:  None.  FINDINGS: Gallbladder: Cholelithiasis without pericholecystic fluid or gallbladder wall thickening. Ring down artifact from the gallbladder wall as can be seen with adenomyomatosis. Positive sonographic Murphy sign.  Common bile duct: Diameter: 3.6 mm  Liver: No focal lesion identified. Increased hepatic parenchymal echogenicity.  IVC: No abnormality visualized.  Pancreas: Visualized portion unremarkable.  Spleen: Size and appearance within normal limits.  Right Kidney: Length: 10.6 cm. Echogenicity within normal limits. No mass or hydronephrosis visualized.  Left Kidney: Length: 11.7 cm. Echogenicity within normal limits. No mass or hydronephrosis visualized.  Abdominal aorta: No aneurysm visualized.  Other findings: None.  IMPRESSION: 1. Cholelithiasis without sonographic evidence of acute cholecystitis. 2. Increased hepatic parenchymal echogenicity as can be seen with hepatic steatosis.   Electronically Signed   By: Kathreen Devoid   On: 03/16/2014 01:03    Medications / Allergies: per chart  Antibiotics: Anti-infectives    None      Assessment  Detra M Terlizzi  59 y.o. female       Problem List:  Principal Problem:   Calculus of gallbladder with acute cholecystitis   Recurrent episodes of symptomatic cholecystolithiasis with more constant pain and sonographic Murphy sign suspicious for early acute cholecystitis.  Plan:  Long  discussion with the patient and her husband.  Because of persistent attacks and now persistent pain and right upper  quadrant, I recommended cholecystectomy.  She was hoping to delay this to a more convenient time.  I cautioned her against doing that.  I recommend send she has evidence of persistent pain that I am strongly suspicious that she is developing early cholecystitis and would benefit from admission and cholecystectomy.  She initially wanted to delay surgery but then after discussion I believe began to understand the importance of trying to intervene early and not delay things.  Reasonable laparoscopic approach.  She her husband agree with admission and surgery:  The anatomy & physiology of hepatobiliary & pancreatic function was discussed.  The pathophysiology of gallbladder dysfunction was discussed.  Natural history risks without surgery was discussed.   I feel the risks of no intervention will lead to serious problems that outweigh the operative risks; therefore, I recommended cholecystectomy to remove the pathology.  I explained laparoscopic techniques with possible need for an open approach.  Probable cholangiogram to evaluate the bilary tract was explained as well.    Risks such as bleeding, infection, abscess, leak, injury to other organs, need for further treatment, stroke, heart attack, death, and other risks were discussed.  I noted a good likelihood this will help address the problem.  Possibility that this will not correct all abdominal symptoms was explained.  Goals of post-operative recovery were discussed as well.  We will work to minimize complications.  An educational handout further explaining the pathology and treatment options was given as well.  Questions were answered.  The patient expresses understanding & wishes to proceed with surgery.  -Admit.  IV antibiotics.  IV fluids.  Nausea and pain control.  -VTE prophylaxis- SCDs, etc  -mobilize as tolerated to help recovery    Adin Hector, M.D., F.A.C.S. Gastrointestinal and Minimally Invasive Surgery [facility] [address], [place], [place] [address] [phone] Main / Paging   03/16/2014  Note: Portions of this report may have been transcribed using voice recognition software. Every effort was made to ensure accuracy; however, inadvertent computerized transcription errors may be present.   Any transcriptional errors that result from this process are unintentional.

## 2014-03-16 NOTE — Consult Note (Signed)
Subjective:   HPI  The patient is a 59 year old female who underwent laparoscopic cholecystectomy earlier today with intraoperative cholangiograms. She was diagnosed with acute cholecystitis with gallstones. The intraoperative cholangiogram showed a meniscus sign in the distal common bile duct suspicious for common bile duct stone. Her liver enzymes are elevated. We were asked to see her in regards to ERCP.  Review of Systems No chest pain or shortness of breath  Past Medical History  Diagnosis Date  . Dizziness and giddiness   . Calculus of gallbladder with acute cholecystitis 03/16/2014  . Rash of entire body since October 2015 03/16/2014   Past Surgical History  Procedure Laterality Date  . Bunionectomy    . Breast lumpectomy      Bilateral   History   Social History  . Marital Status: Married    Spouse Name: N/A    Number of Children: N/A  . Years of Education: N/A   Occupational History  . Not on file.   Social History Main Topics  . Smoking status: Never Smoker   . Smokeless tobacco: Not on file  . Alcohol Use: Not on file  . Drug Use: Not on file  . Sexual Activity: Not on file   Other Topics Concern  . Not on file   Social History Narrative   family history includes COPD in her mother; Cancer in her sister; Cataracts in her father; Heart disease in her brother; Hypertension in her mother; Lymphoma in her brother.  Current facility-administered medications:  .  acetaminophen (TYLENOL) tablet 650 mg, 650 mg, Oral, Q6H PRN **OR** acetaminophen (TYLENOL) suppository 650 mg, 650 mg, Rectal, Q6H PRN, Michael Boston, MD .  alum & mag hydroxide-simeth (MAALOX/MYLANTA) 200-200-20 MG/5ML suspension 30 mL, 30 mL, Oral, Q6H PRN, Michael Boston, MD .  bisacodyl (DULCOLAX) suppository 10 mg, 10 mg, Rectal, Q12H PRN, Michael Boston, MD .  cefTRIAXone (ROCEPHIN) 2 g in dextrose 5 % 50 mL IVPB, 2 g, Intravenous, Q24H, Michael Boston, MD, 2 g at 03/16/14 0450 .  chlorhexidine  (HIBICLENS) 4 % liquid 1 application, 1 application, Topical, Once, Michael Boston, MD .  Derrill Memo ON 03/17/2014] chlorhexidine (HIBICLENS) 4 % liquid 1 application, 1 application, Topical, Once, Michael Boston, MD .  dextrose 5 % and 0.45 % NaCl with KCl 40 mEq/L infusion, , Intravenous, Continuous, Michael Boston, MD, Last Rate: 50 mL/hr at 03/16/14 1030 .  diphenhydrAMINE (BENADRYL) injection 12.5-25 mg, 12.5-25 mg, Intravenous, Q6H PRN **OR** diphenhydrAMINE (BENADRYL) 12.5 MG/5ML elixir 12.5-25 mg, 12.5-25 mg, Oral, Q6H PRN, Michael Boston, MD .  Derrill Memo ON 03/17/2014] heparin injection 5,000 Units, 5,000 Units, Subcutaneous, 3 times per day, Michael Boston, MD .  HYDROmorphone (DILAUDID) 1 MG/ML injection, , , ,  .  HYDROmorphone (DILAUDID) 1 MG/ML injection, , , ,  .  HYDROmorphone (DILAUDID) 1 MG/ML injection, , , ,  .  HYDROmorphone (DILAUDID) injection 0.25-0.5 mg, 0.25-0.5 mg, Intravenous, Q5 min PRN, Venia Carbon. Royce Macadamia, MD, 0.5 mg at 03/16/14 0950 .  HYDROmorphone (DILAUDID) injection 0.5-2 mg, 0.5-2 mg, Intravenous, Q2H PRN, Michael Boston, MD, 1 mg at 03/16/14 1238 .  ibuprofen (ADVIL,MOTRIN) tablet 400-800 mg, 400-800 mg, Oral, Q6H PRN, Michael Boston, MD .  lactated ringers bolus 1,000 mL, 1,000 mL, Intravenous, Once, Michael Boston, MD .  lactated ringers bolus 1,000 mL, 1,000 mL, Intravenous, Q8H PRN, Michael Boston, MD .  lip balm (CARMEX) ointment 1 application, 1 application, Topical, BID, Michael Boston, MD, 1 application at 75/64/33 1238 .  lip balm (CARMEX)  ointment, , , ,  .  loratadine (CLARITIN) tablet 10 mg, 10 mg, Oral, Daily, Michael Boston, MD, 10 mg at 03/16/14 1000 .  magic mouthwash, 15 mL, Oral, QID PRN, Michael Boston, MD .  menthol-cetylpyridinium (CEPACOL) lozenge 3 mg, 1 lozenge, Oral, PRN, Michael Boston, MD .  methocarbamol (ROBAXIN) 1,000 mg in dextrose 5 % 50 mL IVPB, 1,000 mg, Intravenous, Q6H PRN, Michael Boston, MD, 1,000 mg at 03/16/14 0940 .  metoprolol (LOPRESSOR) injection 5 mg, 5  mg, Intravenous, Q6H PRN, Michael Boston, MD .  metoprolol tartrate (LOPRESSOR) tablet 12.5 mg, 12.5 mg, Oral, Q12H PRN, Michael Boston, MD .  ondansetron [facility]) injection 4 mg, 4 mg, Intravenous, Q6H PRN, Michael Boston, MD, 4 mg at 03/16/14 1237 .  oxyCODONE (Oxy IR/ROXICODONE) immediate release tablet 5-10 mg, 5-10 mg, Oral, Q4H PRN, Michael Boston, MD .  phenol (CHLORASEPTIC) mouth spray 2 spray, 2 spray, Mouth/Throat, PRN, Michael Boston, MD .  polyethylene glycol (MIRALAX / GLYCOLAX) packet 17 g, 17 g, Oral, Q12H PRN, Michael Boston, MD .  promethazine (PHENERGAN) 25 MG/ML injection, , , ,  .  promethazine (PHENERGAN) injection 6.25-12.5 mg, 6.25-12.5 mg, Intravenous, Q4H PRN, Michael Boston, MD .  saccharomyces boulardii (FLORASTOR) capsule 250 mg, 250 mg, Oral, BID, Michael Boston, MD, 250 mg at 03/16/14 1000 .  sodium chloride 0.9 % injection, , , ,  .  zolpidem (AMBIEN) tablet 5 mg, 5 mg, Oral, QHS PRN, Michael Boston, MD Allergies  Allergen Reactions  . Morphine And Related     itching     Objective:     BP 124/72 mmHg  Pulse 76  Temp(Src) 98.6 F (37 C) (Oral)  Resp 16  Ht 5\' 7"  (1.702 m)  Wt 88.542 kg (195 lb 3.2 oz)  BMI 30.57 kg/m2  SpO2 96%  She is in no acute distress  She is alert and oriented at this time.  Heart regular rhythm no murmurs  Lungs Mcvicar  Abdomen: Some discomfort,  postop from earlier today.  Laboratory No components found for: D1    Assessment:     Status post laparoscopic cholecystectomy with abnormal intraoperative cholangiogram suspicious for distal common bile duct stone.      Plan:     I recommended ERCP with sphincterotomy and stone extraction. The procedure was explained in detail to her along with the potential risks of bleeding, infection, perforation, and pancreatitis. She understands and is agreeable to have the procedure done. We will make arrangements for doing the procedure on Monday either at [facility] or at [facility] depending on availability of endoscopy and anesthesia.

## 2014-03-16 NOTE — Anesthesia Postprocedure Evaluation (Signed)
  Anesthesia Post-op Note  Patient: Ann Hill  Procedure(s) Performed: Procedure(s): SINGLE SITE LAPAROSCOPIC CHOLECYSTECTOMY WITH INTRAOPERATIVE CHOLANGIOGRAM (N/A)  Patient Location: PACU  Anesthesia Type:General  Level of Consciousness: awake, alert  and oriented  Airway and Oxygen Therapy: Patient Spontanous Breathing and Patient connected to nasal cannula oxygen  Post-op Pain: mild  Post-op Assessment: Post-op Vital signs reviewed, Patient's Cardiovascular Status Stable, Respiratory Function Stable, Patent Airway, No signs of Nausea or vomiting and Pain level controlled. Right shoulder pain from CO2 insufflation- controlled with narcotics and Robaxin.  Post-op Vital Signs: Reviewed and stable  Last Vitals:  Filed Vitals:   03/16/14 0950  BP:   Pulse: 71  Temp:   Resp: 13    Complications: No apparent anesthesia complications

## 2014-03-16 NOTE — Op Note (Signed)
03/15/2014 - 03/16/2014  9:06 AM  PATIENT:  Ann Hill  59 y.o. female  No care team member to display  PRE-OPERATIVE DIAGNOSIS:  cholecystitis  POST-OPERATIVE DIAGNOSIS:    ACUTE CHOLECYSTITIS COMMON BILE DUCT OBSTRUCTION  PROCEDURE:  Procedure(s): SINGLE SITE LAPAROSCOPIC CHOLECYSTECTOMY WITH INTRAOPERATIVE CHOLANGIOGRAM  SURGEON:  Surgeon(s): Michael Boston, MD  ASSISTANT: RN   ANESTHESIA:   local and general  EBL:  Total I/O In: 1000 [I.V.:1000] Out: -   Delay start of Pharmacological VTE agent (>24hrs) due to surgical blood loss or risk of bleeding:  no  DRAINS: none   SPECIMEN:  Source of Specimen:  Gallbladder   DISPOSITION OF SPECIMEN:  PATHOLOGY  COUNTS:  YES  PLAN OF CARE: Admit to inpatient   PATIENT DISPOSITION:  PACU - hemodynamically stable.  INDICATION: Pleasant overweight female with intermittent bouts of upper abdominal pain worsening over the week.  Now more constant.  Came to emergency room.  Requiring  IV narcotics.  Positive sonographic and physical Murphy sign suspicious for early acute cholecystitis.  I recommended cholecystectomy.  She and her husband agreed:  The anatomy & physiology of hepatobiliary & pancreatic function was discussed.  The pathophysiology of gallbladder dysfunction was discussed.  Natural history risks without surgery was discussed.   I feel the risks of no intervention will lead to serious problems that outweigh the operative risks; therefore, I recommended cholecystectomy to remove the pathology.  I explained laparoscopic techniques with possible need for an open approach.  Probable cholangiogram to evaluate the bilary tract was explained as well.    Risks such as bleeding, infection, abscess, leak, injury to other organs, need for further treatment, heart attack, death, and other risks were discussed.  I noted a good likelihood this will help address the problem.  Possibility that this will not correct all abdominal symptoms  was explained.  Goals of post-operative recovery were discussed as well.  We will work to minimize complications.  An educational handout further explaining the pathology and treatment options was given as well.  Questions were answered.  The patient expresses understanding & wishes to proceed with surgery.  OR FINDINGS: Dilated boggy gallbladder with edema suspicious for early cholecystitis.  Classic cholesterol medium-sized stones.  Dilated cystic duct.  Dilated very dilated biliary system.  Classic reverse meniscus sign aT distal common bile duct with no contrast able to get into the duodenum.   Probable 5 mm stone.  Very suspicious for distal common bile duct stone causing obstruction.  Radiology agrees.  DESCRIPTION:   The patient was identified & brought in the operating room.  She still had a diffuse truncal macular papular rash.  She states she has had it for months.  Did not seem to have changed since I saw her on consultation in the middle of the night.  The patient was positioned supine with arms tucked. SCDs were active during the entire case. The patient underwent general anesthesia without any difficulty.  The abdomen was prepped and draped in a sterile fashion. A Surgical Timeout confirmed our plan.  I made a transverse curvilinear incision through the superior umbilical fold.  I placed a 32mm long port through the supraumbilical fascia using a modified Hassan cutdown technique. I began carbon dioxide insufflation. Camera inspection revealed no injury. There were no adhesions to the anterior abdominal wall supraumbilically.  I proceeded to continue with single site technique. I placed a #5 port in left upper aspect of the wound. I placed a 5 mm  atraumatic grasper in the right inferior aspect of the wound.  I turned attention to the right upper quadrant.  Gallbladder was thickened and dilated with edema.  The gallbladder fundus was elevated cephalad.  It was rather turgid but I was able to  grasp and elevated.  Unfortunately it was friable and did have leaking of bile and one small stone that was seen and removed.  I freed the peritoneal coverings between the gallbladder and the liver on the posteriolateral and anteriomedial walls. I alternated between Harmonic & blunt Maryland dissection to help get a good critical view of the cystic artery and cystic duct. I did further dissection to free 90% of the gallbladder off the liver bed to get a good critical view of the infundibulum and cystic duct. I mobilized the cystic artery; and, after getting a good 360 view, ligated the cystic artery using the Harmonic ultrasonic dissection. I skeletonized the cystic duct.  It was rather dilated.  I placed a clip on the infundibulum. I did a partial cystic duct-otomy and ensured patency. I placed a 5 [place] cholangiocatheter through a puncture site at the right subcostal ridge of the abdominal wall and directed it into the cystic duct.  We ran a cholangiogram with dilute radio-opaque contrast and continuous fluoroscopy.  Contrast flowed from a side branch consistent with cystic duct cannulization. Contrast flowed up the common hepatic duct into the right and left intrahepatic chains out to secondary radicals. Contrast flowed down the common bile duct  very slowly.  The entire biliary system was rather dilated.  It stopped at the distal common bile duct with a reverse meniscus sign.  I try to do flushing and rerunning but no contrast would pass across the reverse meniscus sign.  Very suspicious for distal common bile duct stone.    I removed the cholangiocatheter. I placed clips on the cystic duct x4.  I completed cystic duct transection.   PEG is assisted duct was rather dilated and anticipated need for ERCP and clips.  We came across it, I did further ligation of the cystic duct stump with a 0 PDS Endoloop to good result.   I freed the gallbladder from its remaining attachments to the liver. I ensured  hemostasis on the gallbladder fossa of the liver and elsewhere. I inspected the rest of the abdomen & detected no injury nor bleeding elsewhere.  I placed the gallbladder into an Endo Catch bag.  I did copious irrigation with several liters of saline.  Meticulous inspected.  No other stones or abnormalities seen.  No leak of blood or bile.  Hemostasis excellent.  Ever had some mild fatty change but not severe.  Notes of cirrhosis.  Saw no need for liver biopsy.    I removed the gallbladder out the supraumbilical fascia. I closed the fascia transversely using 0 Vicryl interrupted stitches. A closed the skin using 4-0 monocryl stitch.  Sterile dressing was applied. The patient was extubated & arrived in the PACU in stable condition..  I had discussed postoperative care with the patient in the holding area.  I called Dr. Acquanetta Sit with [facility] gastroenterology to explain findings and requests consultation for probable need for ERCP.  He will see the patient later when she is more alert.  I tried to locate the husband but cannot find him in the waiting area.  He was not in the room upon the floor.  I left a message at his home phone.  10 minutes later the floor  called and noted that he had just come back into the hospital. I updated the status of the patient to the patient's husband.  I made recommendations.  I answered questions.  Understanding & appreciation was expressed.  Instructions are written in the chart as well.  Adin Hector, M.D., F.A.C.S. Gastrointestinal and Minimally Invasive Surgery [facility] [address], [place], [place] [address] [phone] Main / Paging

## 2014-03-16 NOTE — Anesthesia Preprocedure Evaluation (Addendum)
Anesthesia Evaluation  Patient identified by MRN, date of birth, ID band Patient awake    Reviewed: Allergy & Precautions, NPO status , Patient's Chart, lab work & pertinent test results  Airway Mallampati: I  TM Distance: >3 FB Neck ROM: Full    Dental no notable dental hx. (+) Caps,    Pulmonary neg pulmonary ROS,  breath sounds Chimento to auscultation  Pulmonary exam normal       Cardiovascular negative cardio ROS  Rhythm:Regular Rate:Normal     Neuro/Psych negative neurological ROS  negative psych ROS   GI/Hepatic Neg liver ROS, Cholelithiasis with acute cholecystitis   Endo/Other  negative endocrine ROS  Renal/GU negative Renal ROS  negative genitourinary   Musculoskeletal negative musculoskeletal ROS (+)   Abdominal (+) + obese,   Peds  Hematology negative hematology ROS (+)   Anesthesia Other Findings Generalized erythematous maculopapular rash.  Reproductive/Obstetrics negative OB ROS                           Anesthesia Physical Anesthesia Plan  ASA: II  Anesthesia Plan: General   Post-op Pain Management:    Induction: Intravenous and Cricoid pressure planned  Airway Management Planned: Oral ETT  Additional Equipment:   Intra-op Plan:   Post-operative Plan: Extubation in OR  Informed Consent: I have reviewed the patients History and Physical, chart, labs and discussed the procedure including the risks, benefits and alternatives for the proposed anesthesia with the patient or authorized representative who has indicated his/her understanding and acceptance.   Dental advisory given  Plan Discussed with: CRNA, Anesthesiologist and Surgeon  Anesthesia Plan Comments:         Anesthesia Quick Evaluation

## 2014-03-17 ENCOUNTER — Encounter (HOSPITAL_COMMUNITY): Payer: Self-pay | Admitting: Surgery

## 2014-03-17 LAB — COMPREHENSIVE METABOLIC PANEL
ALT: 1106 U/L — ABNORMAL HIGH (ref 0–35)
AST: 713 U/L — ABNORMAL HIGH (ref 0–37)
Albumin: 3.7 g/dL (ref 3.5–5.2)
Alkaline Phosphatase: 87 U/L (ref 39–117)
Anion gap: 5 (ref 5–15)
BUN: 11 mg/dL (ref 6–23)
CO2: 28 mmol/L (ref 19–32)
CREATININE: 0.83 mg/dL (ref 0.50–1.10)
Calcium: 9.1 mg/dL (ref 8.4–10.5)
Chloride: 102 mmol/L (ref 96–112)
GFR calc Af Amer: 90 mL/min — ABNORMAL LOW (ref >90)
GFR, EST NON AFRICAN AMERICAN: 77 mL/min — AB (ref >90)
Glucose, Bld: 141 mg/dL — ABNORMAL HIGH (ref 70–99)
Potassium: 5.3 mmol/L — ABNORMAL HIGH (ref 3.5–5.1)
Sodium: 135 mmol/L (ref 135–145)
Total Bilirubin: 2 mg/dL — ABNORMAL HIGH (ref 0.3–1.2)
Total Protein: 6.6 g/dL (ref 6.0–8.3)

## 2014-03-17 LAB — CBC
HCT: 44.6 % (ref 36.0–46.0)
Hemoglobin: 14.4 g/dL (ref 12.0–15.0)
MCH: 29.9 pg (ref 26.0–34.0)
MCHC: 32.3 g/dL (ref 30.0–36.0)
MCV: 92.5 fL (ref 78.0–100.0)
PLATELETS: 283 10*3/uL (ref 150–400)
RBC: 4.82 MIL/uL (ref 3.87–5.11)
RDW: 13.9 % (ref 11.5–15.5)
WBC: 11.4 10*3/uL — ABNORMAL HIGH (ref 4.0–10.5)

## 2014-03-17 MED ORDER — KETOROLAC TROMETHAMINE 15 MG/ML IJ SOLN
15.0000 mg | Freq: Four times a day (QID) | INTRAMUSCULAR | Status: DC
Start: 2014-03-17 — End: 2014-03-19
  Administered 2014-03-17 – 2014-03-19 (×7): 15 mg via INTRAVENOUS
  Filled 2014-03-17: qty 1
  Filled 2014-03-17: qty 2
  Filled 2014-03-17 (×2): qty 1
  Filled 2014-03-17 (×2): qty 2
  Filled 2014-03-17 (×4): qty 1
  Filled 2014-03-17: qty 2

## 2014-03-17 MED ORDER — SODIUM CHLORIDE 0.9 % IV SOLN
INTRAVENOUS | Status: DC
  Administered 2014-03-17 (×2): via INTRAVENOUS
  Administered 2014-03-18: 1000 mL via INTRAVENOUS
  Administered 2014-03-18: 10:00:00 via INTRAVENOUS

## 2014-03-17 MED ORDER — ACETAMINOPHEN 500 MG PO TABS
1000.0000 mg | ORAL_TABLET | Freq: Three times a day (TID) | ORAL | Status: DC
  Administered 2014-03-17 (×3): 1000 mg via ORAL
  Filled 2014-03-17 (×9): qty 2

## 2014-03-17 MED ORDER — POLYETHYLENE GLYCOL 3350 17 G PO PACK
17.0000 g | PACK | Freq: Two times a day (BID) | ORAL | Status: DC
  Administered 2014-03-17 – 2014-03-18 (×3): 17 g via ORAL
  Filled 2014-03-17 (×5): qty 1

## 2014-03-17 NOTE — Progress Notes (Signed)
[facility]  [address] [place], [place] [address] Phone: [phone] FAX: [phone]    Ann Hill [id number] 12-06-1955  CARE TEAM:  PCP: No primary care provider on file.  Outpatient Care Team: No care team member to display  Inpatient Treatment Team: Treatment Team: Attending Provider: Md Edison Pace, MD; Consulting Physician: Md Edison Pace, MD; Consulting Physician: Wonda Horner, MD   Subjective:  Tired Very sore last night - pain better controlled now - RUQ  Objective:  Vital signs:  Filed Vitals:   03/16/14 1758 03/16/14 2106 03/17/14 0219 03/17/14 0602  BP: 131/85 154/82 147/78 143/77  Pulse: 81 75 78 99  Temp: 98.2 F (36.8 C) 98.3 F (36.8 C) 99.5 F (37.5 C) 98.5 F (36.9 C)  TempSrc: Oral Oral Oral Oral  Resp: _0 Height:      Weight:      SpO2: 98% 93% 95% 97%       Intake/Output   Yesterday:  01/30 0701 - 01/31 0700 In: 3479.2 [I.V.:3309.2; IV Piggyback:170] Out: 1950 [Urine:1950] This shift:  Total I/O In: 0  Out: 150 [Urine:150]  Bowel function:  Flatus: y  BM: n  Drain: n/a  Physical Exam:  General: Pt awake/alert/oriented x4 in no acute distress Eyes: PERRL, normal EOM.  Sclera [place].  No icterus Neuro: CN II-XII intact w/o focal sensory/motor deficits. Lymph: No head/neck/groin lymphadenopathy Psych:  No delerium/psychosis/paranoia HENT: Normocephalic, Mucus membranes moist.  No thrush Neck: Supple, No tracheal deviation Chest: No chest wall pain w good excursion CV:  Pulses intact.  Regular rhythm MS: Normal AROM mjr joints.  No obvious deformity Abdomen: Soft.  Nondistended.   Moderately tender at RUQ only.  No evidence of peritonitis.  No incarcerated hernias. Ext:  SCDs BLE.  No mjr edema.  No cyanosis Skin: No petechiae / purpura.  Diffuse truncal rash stable - no very Sx   Problem List:   Principal Problem:   Common bile duct (CBD) obstruction, probable stone Active  Problems:   Acute calculous cholecystitis s/p lap chole w IOC 03/16/2014   Rash of entire body since October 2015   Assessment  Ann Hill  59 y.o. female  1 Day Post-Op  Procedure(s): SINGLE SITE LAPAROSCOPIC CHOLECYSTECTOMY WITH INTRAOPERATIVE CHOLANGIOGRAM  POST-OPERATIVE DIAGNOSIS:   ACUTE CHOLECYSTITIS COMMON BILE DUCT OBSTRUCTION  PROCEDURE: Procedure(s): SINGLE SITE LAPAROSCOPIC CHOLECYSTECTOMY WITH INTRAOPERATIVE CHOLANGIOGRAM  SURGEON: Surgeon(s): Michael Boston, MD  OK  Plan:  -improve nonnarc pain control - try Toradol IV  -d/w GI - plan ERCP tomorrow for CBD stone.  Prob Dr Amedeo Plenty  -cont Abx to prevent cholangitis  -VTE prophylaxis- SCDs, etc  -rash stable w/o change w narcotics/meds.  Benadryl helps.  Consider outpt Derm eval  -mobilize as tolerated to help recovery  I updated the patient's status to the patient & husband.  Also w Dr Penelope Coop with [facility].  Recommendations were made.  Questions were answered.  They expressed understanding & appreciation.   Adin Hector, M.D., F.A.C.S. Gastrointestinal and Minimally Invasive Surgery [facility] [address] [place], [place] [address] [phone] Main / Paging   03/17/2014   Results:   Labs: Results for orders placed or performed during the hospital encounter of 03/15/14 (from the past 48 hour(s))  Urinalysis, Routine w reflex microscopic     Status: Abnormal   Collection Time: 03/15/14 10:27 PM  Result Value Ref Range   Color, Urine YELLOW  YELLOW   APPearance Hammonds Easom   Specific Gravity, Urine 1.017 1.005 - 1.030   pH 7.5 5.0 - 8.0   Glucose, UA NEGATIVE NEGATIVE mg/dL   Hgb urine dipstick NEGATIVE NEGATIVE   Bilirubin Urine NEGATIVE NEGATIVE   Ketones, ur NEGATIVE NEGATIVE mg/dL   Protein, ur NEGATIVE NEGATIVE mg/dL   Urobilinogen, UA 0.2 0.0 - 1.0 mg/dL   Nitrite NEGATIVE NEGATIVE   Leukocytes, UA SMALL (A) NEGATIVE  Urine microscopic-add on      Status: Abnormal   Collection Time: 03/15/14 10:27 PM  Result Value Ref Range   Squamous Epithelial / LPF RARE RARE   WBC, UA 3-6 <3 WBC/hpf   RBC / HPF 0-2 <3 RBC/hpf   Bacteria, UA FEW (A) RARE  CBC with Differential     Status: Abnormal   Collection Time: 03/15/14 11:21 PM  Result Value Ref Range   WBC 10.7 (H) 4.0 - 10.5 K/uL   RBC 4.94 3.87 - 5.11 MIL/uL   Hemoglobin 15.0 12.0 - 15.0 g/dL   HCT 45.0 36.0 - 46.0 %   MCV 91.1 78.0 - 100.0 fL   MCH 30.4 26.0 - 34.0 pg   MCHC 33.3 30.0 - 36.0 g/dL   RDW 13.7 11.5 - 15.5 %   Platelets 297 150 - 400 K/uL   Neutrophils Relative % 82 (H) 43 - 77 %   Neutro Abs 8.7 (H) 1.7 - 7.7 K/uL   Lymphocytes Relative 10 (L) 12 - 46 %   Lymphs Abs 1.1 0.7 - 4.0 K/uL   Monocytes Relative 7 3 - 12 %   Monocytes Absolute 0.8 0.1 - 1.0 K/uL   Eosinophils Relative 1 0 - 5 %   Eosinophils Absolute 0.1 0.0 - 0.7 K/uL   Basophils Relative 0 0 - 1 %   Basophils Absolute 0.0 0.0 - 0.1 K/uL  Comprehensive metabolic panel     Status: Abnormal   Collection Time: 03/15/14 11:22 PM  Result Value Ref Range   Sodium 138 135 - 145 mmol/L   Potassium 3.8 3.5 - 5.1 mmol/L   Chloride 104 96 - 112 mmol/L   CO2 26 19 - 32 mmol/L   Glucose, Bld 112 (H) 70 - 99 mg/dL   BUN 16 6 - 23 mg/dL   Creatinine, Ser 0.83 0.50 - 1.10 mg/dL   Calcium 9.4 8.4 - 10.5 mg/dL   Total Protein 6.7 6.0 - 8.3 g/dL   Albumin 4.2 3.5 - 5.2 g/dL   AST 180 (H) 0 - 37 U/L   ALT 132 (H) 0 - 35 U/L   Alkaline Phosphatase 66 39 - 117 U/L   Total Bilirubin 0.7 0.3 - 1.2 mg/dL   GFR calc non Af Amer 77 (L) >90 mL/min   GFR calc Af Amer 90 (L) >90 mL/min    Comment: (NOTE) The eGFR has been calculated using the CKD EPI equation. This calculation has not been validated in all clinical situations. eGFR's persistently <90 mL/min signify possible Chronic Kidney Disease.    Anion gap 8 5 - 15  Lipase, blood     Status: None   Collection Time: 03/15/14 11:22 PM  Result Value Ref Range    Lipase 51 11 - 59 U/L  Surgical pcr screen     Status: None   Collection Time: 03/16/14  4:33 AM  Result Value Ref Range   MRSA, PCR NEGATIVE NEGATIVE   Staphylococcus aureus NEGATIVE NEGATIVE    Comment:  The Xpert SA Assay (FDA approved for NASAL specimens in patients over 67 years of age), is one component of a comprehensive surveillance program.  Test performance has been validated by [facility] for patients greater than or equal to 78 year old. It is not intended to diagnose infection nor to guide or monitor treatment.   Comprehensive metabolic panel     Status: Abnormal   Collection Time: 03/17/14  5:55 AM  Result Value Ref Range   Sodium 135 135 - 145 mmol/L   Potassium 5.3 (H) 3.5 - 5.1 mmol/L    Comment: DELTA CHECK NOTED REPEATED TO VERIFY SLIGHT HEMOLYSIS    Chloride 102 96 - 112 mmol/L   CO2 28 19 - 32 mmol/L   Glucose, Bld 141 (H) 70 - 99 mg/dL   BUN 11 6 - 23 mg/dL   Creatinine, Ser 0.83 0.50 - 1.10 mg/dL   Calcium 9.1 8.4 - 10.5 mg/dL   Total Protein 6.6 6.0 - 8.3 g/dL   Albumin 3.7 3.5 - 5.2 g/dL   AST 713 (H) 0 - 37 U/L   ALT 1106 (H) 0 - 35 U/L   Alkaline Phosphatase 87 39 - 117 U/L   Total Bilirubin 2.0 (H) 0.3 - 1.2 mg/dL   GFR calc non Af Amer 77 (L) >90 mL/min   GFR calc Af Amer 90 (L) >90 mL/min    Comment: (NOTE) The eGFR has been calculated using the CKD EPI equation. This calculation has not been validated in all clinical situations. eGFR's persistently <90 mL/min signify possible Chronic Kidney Disease.    Anion gap 5 5 - 15  CBC     Status: Abnormal   Collection Time: 03/17/14  5:55 AM  Result Value Ref Range   WBC 11.4 (H) 4.0 - 10.5 K/uL   RBC 4.82 3.87 - 5.11 MIL/uL   Hemoglobin 14.4 12.0 - 15.0 g/dL   HCT 44.6 36.0 - 46.0 %   MCV 92.5 78.0 - 100.0 fL   MCH 29.9 26.0 - 34.0 pg   MCHC 32.3 30.0 - 36.0 g/dL   RDW 13.9 11.5 - 15.5 %   Platelets 283 150 - 400 K/uL    Imaging / Studies: Dg Cholangiogram  Operative  03/16/2014   CLINICAL DATA:  Cholelithiasis, cholecystitis  EXAM: INTRAOPERATIVE CHOLANGIOGRAM  TECHNIQUE: Cholangiographic images from the C-arm fluoroscopic device were submitted for interpretation post-operatively. Please see the procedural report for the amount of contrast and the fluoroscopy time utilized.  COMPARISON:  None.  FINDINGS: There is a meniscus in the distal common bile duct with no contrast passage distally into the duodenum. No mobile filling defects in the common duct. Intrahepatic ducts are incompletely visualized, appearing decompressed centrally.  : 1. Distal CBD obstruction suggesting retained calculus.   Electronically Signed   By: Arne Cleveland M.D.   On: 03/16/2014 08:47   US Abdomen Complete  03/16/2014   CLINICAL DATA:  Right upper quadrant pain  EXAM: ULTRASOUND ABDOMEN COMPLETE  COMPARISON:  None.  FINDINGS: Gallbladder: Cholelithiasis without pericholecystic fluid or gallbladder wall thickening. Ring down artifact from the gallbladder wall as can be seen with adenomyomatosis. Positive sonographic Murphy sign.  Common bile duct: Diameter: 3.6 mm  Liver: No focal lesion identified. Increased hepatic parenchymal echogenicity.  IVC: No abnormality visualized.  Pancreas: Visualized portion unremarkable.  Spleen: Size and appearance within normal limits.  Right Kidney: Length: 10.6 cm. Echogenicity within normal limits. No mass or hydronephrosis visualized.  Left Kidney: Length: 11.7 cm. Echogenicity  within normal limits. No mass or hydronephrosis visualized.  Abdominal aorta: No aneurysm visualized.  Other findings: None.  IMPRESSION: 1. Cholelithiasis without sonographic evidence of acute cholecystitis. 2. Increased hepatic parenchymal echogenicity as can be seen with hepatic steatosis.   Electronically Signed   By: Kathreen Devoid   On: 03/16/2014 01:03    Medications / Allergies: per chart  Antibiotics: Anti-infectives    Start     Dose/Rate Route Frequency Ordered  Stop   03/16/14 0400  cefTRIAXone (ROCEPHIN) 2 g in dextrose 5 % 50 mL IVPB    Comments:  Pharmacy may adjust dosing strength / duration / interval for maximal efficacy   2 g100 mL/hr over 30 Minutes Intravenous Every 24 hours 03/16/14 0313         Note: Portions of this report may have been transcribed using voice recognition software. Every effort was made to ensure accuracy; however, inadvertent computerized transcription errors may be present.   Any transcriptional errors that result from this process are unintentional.

## 2014-03-17 NOTE — OR Nursing (Signed)
Ercp for mon feb 1 changed to open anesthesia block in [facility] endo at 0730, ok'd per Dr Amedeo Plenty. Jess, RN for patient in 1522 made aware of not transporting patient to [facility] Earnstine Regal, RN Endo

## 2014-03-18 ENCOUNTER — Inpatient Hospital Stay (HOSPITAL_COMMUNITY): Payer: BLUE CROSS/BLUE SHIELD | Admitting: Anesthesiology

## 2014-03-18 ENCOUNTER — Inpatient Hospital Stay (HOSPITAL_COMMUNITY): Payer: BLUE CROSS/BLUE SHIELD

## 2014-03-18 ENCOUNTER — Encounter (HOSPITAL_COMMUNITY): Admission: EM | Disposition: A | Discharge status: Home / Self Care | Payer: Self-pay | Source: Home / Self Care

## 2014-03-18 ENCOUNTER — Encounter (HOSPITAL_COMMUNITY): Payer: Self-pay | Admitting: Anesthesiology

## 2014-03-18 HISTORY — PX: ENDOSCOPIC RETROGRADE CHOLANGIOPANCREATOGRAPHY (ERCP) WITH PROPOFOL: SHX5810

## 2014-03-18 SURGERY — ENDOSCOPIC RETROGRADE CHOLANGIOPANCREATOGRAPHY (ERCP) WITH PROPOFOL
Anesthesia: General

## 2014-03-18 SURGERY — ERCP, WITH INTERVENTION IF INDICATED
Anesthesia: Monitor Anesthesia Care

## 2014-03-18 MED ORDER — SODIUM CHLORIDE 0.9 % IV SOLN
INTRAVENOUS | Status: DC | PRN
  Administered 2014-03-18: 25 mL

## 2014-03-18 MED ORDER — MIDAZOLAM HCL 5 MG/5ML IJ SOLN
INTRAMUSCULAR | Status: DC | PRN
  Administered 2014-03-18: 2 mg via INTRAVENOUS

## 2014-03-18 MED ORDER — PROPOFOL 10 MG/ML IV BOLUS
INTRAVENOUS | Status: AC
  Filled 2014-03-18: qty 20

## 2014-03-18 MED ORDER — ONDANSETRON HCL 4 MG/2ML IJ SOLN
INTRAMUSCULAR | Status: AC
  Filled 2014-03-18: qty 2

## 2014-03-18 MED ORDER — FENTANYL CITRATE 0.05 MG/ML IJ SOLN
INTRAMUSCULAR | Status: AC
  Filled 2014-03-18: qty 2

## 2014-03-18 MED ORDER — LIDOCAINE HCL (CARDIAC) 20 MG/ML IV SOLN
INTRAVENOUS | Status: DC | PRN
  Administered 2014-03-18: 100 mg via INTRAVENOUS

## 2014-03-18 MED ORDER — LACTATED RINGERS IV SOLN
INTRAVENOUS | Status: DC | PRN
  Administered 2014-03-18: 07:00:00 via INTRAVENOUS

## 2014-03-18 MED ORDER — OXYCODONE HCL 5 MG PO TABS: 5.0000 mg | ORAL_TABLET | ORAL | Status: DC | PRN

## 2014-03-18 MED ORDER — PHENYLEPHRINE 40 MCG/ML (10ML) SYRINGE FOR IV PUSH (FOR BLOOD PRESSURE SUPPORT)
PREFILLED_SYRINGE | INTRAVENOUS | Status: AC
  Filled 2014-03-18: qty 10

## 2014-03-18 MED ORDER — PHENYLEPHRINE HCL 10 MG/ML IJ SOLN
INTRAMUSCULAR | Status: DC | PRN
  Administered 2014-03-18 (×3): 80 ug via INTRAVENOUS

## 2014-03-18 MED ORDER — IBUPROFEN 200 MG PO TABS: ORAL_TABLET | ORAL | Status: DC

## 2014-03-18 MED ORDER — SUCCINYLCHOLINE CHLORIDE 20 MG/ML IJ SOLN
INTRAMUSCULAR | Status: DC | PRN
  Administered 2014-03-18: 100 mg via INTRAVENOUS

## 2014-03-18 MED ORDER — LIDOCAINE HCL (CARDIAC) 20 MG/ML IV SOLN
INTRAVENOUS | Status: AC
Start: 2014-03-18 — End: 2014-03-18
  Filled 2014-03-18: qty 5

## 2014-03-18 MED ORDER — FENTANYL CITRATE 0.05 MG/ML IJ SOLN
INTRAMUSCULAR | Status: DC | PRN
  Administered 2014-03-18: 100 ug via INTRAVENOUS

## 2014-03-18 MED ORDER — PROPOFOL 10 MG/ML IV BOLUS
INTRAVENOUS | Status: DC | PRN
  Administered 2014-03-18: 140 mg via INTRAVENOUS

## 2014-03-18 MED ORDER — MIDAZOLAM HCL 2 MG/2ML IJ SOLN
INTRAMUSCULAR | Status: AC
  Filled 2014-03-18: qty 2

## 2014-03-18 MED ORDER — ONDANSETRON HCL 4 MG/2ML IJ SOLN
INTRAMUSCULAR | Status: DC | PRN
  Administered 2014-03-18: 4 mg via INTRAVENOUS

## 2014-03-18 MED ORDER — SODIUM CHLORIDE 0.9 % IV SOLN: INTRAVENOUS | Status: DC

## 2014-03-18 NOTE — Anesthesia Preprocedure Evaluation (Signed)
Anesthesia Evaluation  Patient identified by MRN, date of birth, ID band Patient awake    Reviewed: Allergy & Precautions, NPO status , Patient's Chart, lab work & pertinent test results  Airway Mallampati: I  TM Distance: >3 FB Neck ROM: Full    Dental no notable dental hx. (+) Caps,    Pulmonary neg pulmonary ROS,  breath sounds [place] to auscultation  Pulmonary exam normal       Cardiovascular negative cardio ROS  Rhythm:Regular Rate:Normal     Neuro/Psych negative neurological ROS  negative psych ROS   GI/Hepatic Neg liver ROS, Cholelithiasis with acute cholecystitis   Endo/Other  negative endocrine ROS  Renal/GU negative Renal ROS  negative genitourinary   Musculoskeletal negative musculoskeletal ROS (+)   Abdominal (+) + obese,   Peds  Hematology negative hematology ROS (+)   Anesthesia Other Findings Generalized erythematous maculopapular rash.  Reproductive/Obstetrics negative OB ROS                             Anesthesia Physical  Anesthesia Plan  ASA: II  Anesthesia Plan: General   Post-op Pain Management:    Induction: Intravenous  Airway Management Planned: Oral ETT  Additional Equipment:   Intra-op Plan:   Post-operative Plan: Extubation in OR  Informed Consent: I have reviewed the patients History and Physical, chart, labs and discussed the procedure including the risks, benefits and alternatives for the proposed anesthesia with the patient or authorized representative who has indicated his/her understanding and acceptance.   Dental advisory given  Plan Discussed with: CRNA, Anesthesiologist and Surgeon  Anesthesia Plan Comments:         Anesthesia Quick Evaluation

## 2014-03-18 NOTE — Anesthesia Postprocedure Evaluation (Signed)
  Anesthesia Post-op Note  Patient: Ann Hill  Procedure(s) Performed: Procedure(s) (LRB): ENDOSCOPIC RETROGRADE CHOLANGIOPANCREATOGRAPHY (ERCP) WITH PROPOFOL (N/A)  Patient Location: PACU  Anesthesia Type: General  Level of Consciousness: awake and alert   Airway and Oxygen Therapy: Patient Spontanous Breathing  Post-op Pain: mild  Post-op Assessment: Post-op Vital signs reviewed, Patient's Cardiovascular Status Stable, Respiratory Function Stable, Patent Airway and No signs of Nausea or vomiting  Last Vitals:  Filed Vitals:   03/18/14 0850  BP: 135/80  Pulse: 72  Temp:   Resp: 17    Post-op Vital Signs: stable   Complications: No apparent anesthesia complications

## 2014-03-18 NOTE — Progress Notes (Signed)
Day of Surgery  Subjective: Had a gas bubble and some pain coming back from ERCP. Otherwise she is fine.  On clears, will advance.    Objective: Vital signs in last 24 hours: Temp:  [97.8 F (36.6 C)-100 F (37.8 C)] 97.8 F (36.6 C) (02/01 0913) Pulse Rate:  [64-81] 64 (02/01 0913) Resp:  [11-18] 16 (02/01 0913) BP: (117-152)/(59-83) 136/76 mmHg (02/01 0913) SpO2:  [93 %-100 %] 100 % (02/01 0913)  npo tM 100, AFEBRILE since.  VSS No labs Intake/Output from previous day: 01/31 0701 - 02/01 0700 In: 7829 [P.O.:370; I.V.:1390; IV Piggyback:50] Out: 5621 [Urine:3325] Intake/Output this shift: Total I/O In: 550 [I.V.:550] Out: -   General appearance: alert, cooperative, no distress and doesn't remember ERCP GI: soft sore, site looks fine.  Lab Results:   Recent Labs  03/15/14 2321 03/17/14 0555  WBC 10.7* 11.4*  HGB 15.0 14.4  HCT 45.0 44.6  PLT 297 283    BMET  Recent Labs  03/15/14 2322 03/17/14 0555  NA 138 135  K 3.8 5.3*  CL 104 102  CO2 26 28  GLUCOSE 112* 141*  BUN 16 11  CREATININE 0.83 0.83  CALCIUM 9.4 9.1   PT/INR No results for input(s): LABPROT, INR in the last 72 hours.   Recent Labs Lab 03/15/14 2322 03/17/14 0555  AST 180* 713*  ALT 132* 1106*  ALKPHOS 66 87  BILITOT 0.7 2.0*  PROT 6.7 6.6  ALBUMIN 4.2 3.7     Lipase     Component Value Date/Time   LIPASE 51 03/15/2014 2322     Studies/Results: No results found.  Medications: . acetaminophen  1,000 mg Oral TID  . cefTRIAXone (ROCEPHIN)  IV  2 g Intravenous Q24H  . chlorhexidine  1 application Topical Once  . chlorhexidine  1 application Topical Once  . heparin  5,000 Units Subcutaneous 3 times per day  . ketorolac  15-30 mg Intravenous 4 times per day  . lactated ringers  1,000 mL Intravenous Once  . lip balm  1 application Topical BID  . loratadine  10 mg Oral Daily  . polyethylene glycol  17 g Oral BID  . saccharomyces boulardii  250 mg Oral BID     Assessment/Plan ACUTE CHOLECYSTITIS, WITH COMMON BILE DUCT OBSTRUCTION S/P SINGLE SITE LAPAROSCOPIC CHOLECYSTECTOMY WITH INTRAOPERATIVE CHOLANGIOGRAM, Ann Boston, Ann Hill. 03/16/14 ERCP with sphincterotomy and stone extraction, 03/18/2014 , Ann Hill    Plan:  Advance diet, ambulate and recheck labs in AM, if ok home in AM.   LOS: 3 days    Ann Hill 03/18/2014

## 2014-03-18 NOTE — Care Management Note (Signed)
    Page 1 of 1   03/18/2014     12:05:27 PM CARE MANAGEMENT NOTE 03/18/2014  Patient:  Ann Hill, Ann Hill   Account Number:  [id number]  Date Initiated:  03/18/2014  Documentation initiated by:  Sunday Spillers  Subjective/Objective Assessment:   59 yo female admitted with cholecystitis, s/p lap chole and ERCP.     Action/Plan:   Home when stable   Anticipated DC Date:  03/19/2014   Anticipated DC Plan:  [place]  CM consult      Choice offered to / List presented to:             Status of service:  Completed, signed off Medicare Important Message given?   (If response is "NO", the following Medicare IM given date fields will be blank) Date Medicare IM given:   Medicare IM given by:   Date Additional Medicare IM given:   Additional Medicare IM given by:    Discharge Disposition:  HOME/SELF CARE  Per UR Regulation:  Reviewed for med. necessity/level of care/duration of stay  If discussed at [place] of Stay Meetings, dates discussed:    Comments:

## 2014-03-18 NOTE — Transfer of Care (Signed)
Immediate Anesthesia Transfer of Care Note  Patient: Ann Hill  Procedure(s) Performed: Procedure(s): ENDOSCOPIC RETROGRADE CHOLANGIOPANCREATOGRAPHY (ERCP) WITH PROPOFOL (N/A)  Patient Location: PACU and Endoscopy Unit  Anesthesia Type:General  Level of Consciousness: awake and oriented  Airway & Oxygen Therapy: Patient Spontanous Breathing and Patient connected to nasal cannula oxygen  Post-op Assessment: Report given to RN and Post -op Vital signs reviewed and stable  Post vital signs: Reviewed and stable  Last Vitals:  Filed Vitals:   03/18/14 0708  BP: 152/83  Pulse:   Temp:   Resp:     Complications: No apparent anesthesia complications

## 2014-03-18 NOTE — Op Note (Signed)
[facility] [place], [id number]   ERCP PROCEDURE REPORT        EXAM DATE: 03/23/14  PATIENT NAME:          Ann Hill, Ann Hill          MR #:        [id number]  BIRTHDATE:       Apr 03, 1955     VISIT #:     [phone] ATTENDING:     Teena Irani, MD     STATUS:     inpatient ASSISTANT:      Cletis [place], Cristopher Estimable, and [place], [place]   INDICATIONS:  The patient is a 59 yr old female here for an ERCP due to PROCEDURE PERFORMED:     ERCP with sphincterotomy and stone extraction MEDICATIONS:     general anesthesia  CONSENT: The patient understands the risks and benefits of the procedure and understands that these risks include, but are not limited to: sedation, allergic reaction, infection, perforation and/or bleeding. Alternative means of evaluation and treatment include, among others: physical exam, x-rays, and/or surgical intervention. The patient elects to proceed with this endoscopic procedure.  DESCRIPTION OF PROCEDURE: During intra-op preparation period all mechanical & medical equipment was checked for proper function. Hand hygiene and appropriate measures for infection prevention was taken. After the risks, benefits and alternatives of the procedure were thoroughly explained, Informed was verified, confirmed and timeout was successfully executed by the treatment team. With the patient in left semi-prone position, medications were administered intravenously.The    was passed from the mouth into the esophagus and further advanced from the esophagus into the stomach. From stomach scope was directed to the   .  Major papilla was aligned with the duodenoscope. The scope position was confirmed fluoroscopically. Rest of the findings/therapeutics are given below. The scope was then completely withdrawn from the patient and the procedure completed. The pulse, BP, and O2 saturation were monitored and documented by the physician and the  nursing staff throughout the entire procedure. The patient was cared for as planned according to standard protocol. The patient was then discharged to recovery in stable condition and with appropriate post procedure care.  prominent papilla with long intraduodenal segment, cannulated bile duct with the assistance of guidewire. No pancreatic duct injections or Y advances were made. A large sphincterotomy was made and then a 12-15 mm balloon catheter used to remove a large multifaceted stone. Multiple balloon sweeps were done afterwards and no further stones were seen either endoscopically or radiologically. There was good drainage of Mantel Amber bile at the end of the procedure    ADVERSE EVENT:     none immediate IMPRESSIONS:     common bile duct stone, removed after sphincterotomy  RECOMMENDATIONS:     monitor for complications, recheck liver function tests in the morning. REPEAT EXAM:   ___________________________________ Teena Irani, MD eSigned:  Teena Irani, MD Mar 23, 2014 8:22 AM   cc:  CPT CODES: ICD9 CODES:  The ICD and CPT codes recommended by this software are interpretations from the data that the clinical staff has captured with the software.  The verification of the translation of this report to the ICD and CPT codes and modifiers is the sole responsibility of the health care institution and practicing physician where this report was generated.  [employer]. will not be held responsible for the validity of the ICD and CPT codes included on this report.  AMA assumes no liability  for data contained or not contained herein. CPT is a Designer, television/film set of the [employer].   PATIENT NAME:  Ann Hill, Ann Hill MR#: [id number]

## 2014-03-18 NOTE — H&P (View-Only) (Signed)
[facility]  [address] [place], [place] [address] Phone: [phone] FAX: [phone]    Ann Hill [id number] 12-06-1955  CARE TEAM:  PCP: No primary care provider on file.  Outpatient Care Team: No care team member to display  Inpatient Treatment Team: Treatment Team: Attending Provider: Md Edison Pace, MD; Consulting Physician: Md Edison Pace, MD; Consulting Physician: Wonda Horner, MD   Subjective:  Tired Very sore last night - pain better controlled now - RUQ  Objective:  Vital signs:  Filed Vitals:   03/16/14 1758 03/16/14 2106 03/17/14 0219 03/17/14 0602  BP: 131/85 154/82 147/78 143/77  Pulse: 81 75 78 99  Temp: 98.2 F (36.8 C) 98.3 F (36.8 C) 99.5 F (37.5 C) 98.5 F (36.9 C)  TempSrc: Oral Oral Oral Oral  Resp: _0 Height:      Weight:      SpO2: 98% 93% 95% 97%       Intake/Output   Yesterday:  01/30 0701 - 01/31 0700 In: 3479.2 [I.V.:3309.2; IV Piggyback:170] Out: 1950 [Urine:1950] This shift:  Total I/O In: 0  Out: 150 [Urine:150]  Bowel function:  Flatus: y  BM: n  Drain: n/a  Physical Exam:  General: Pt awake/alert/oriented x4 in no acute distress Eyes: PERRL, normal EOM.  Sclera Murch.  No icterus Neuro: CN II-XII intact w/o focal sensory/motor deficits. Lymph: No head/neck/groin lymphadenopathy Psych:  No delerium/psychosis/paranoia HENT: Normocephalic, Mucus membranes moist.  No thrush Neck: Supple, No tracheal deviation Chest: No chest wall pain w good excursion CV:  Pulses intact.  Regular rhythm MS: Normal AROM mjr joints.  No obvious deformity Abdomen: Soft.  Nondistended.   Moderately tender at RUQ only.  No evidence of peritonitis.  No incarcerated hernias. Ext:  SCDs BLE.  No mjr edema.  No cyanosis Skin: No petechiae / purpura.  Diffuse truncal rash stable - no very Sx   Problem List:   Principal Problem:   Common bile duct (CBD) obstruction, probable stone Active  Problems:   Acute calculous cholecystitis s/p lap chole w IOC 03/16/2014   Rash of entire body since October 2015   Assessment  Ann Hill  59 y.o. female  1 Day Post-Op  Procedure(s): SINGLE SITE LAPAROSCOPIC CHOLECYSTECTOMY WITH INTRAOPERATIVE CHOLANGIOGRAM  POST-OPERATIVE DIAGNOSIS:   ACUTE CHOLECYSTITIS COMMON BILE DUCT OBSTRUCTION  PROCEDURE: Procedure(s): SINGLE SITE LAPAROSCOPIC CHOLECYSTECTOMY WITH INTRAOPERATIVE CHOLANGIOGRAM  SURGEON: Surgeon(s): Michael Boston, MD  OK  Plan:  -improve nonnarc pain control - try Toradol IV  -d/w GI - plan ERCP tomorrow for CBD stone.  Prob Dr Amedeo Plenty  -cont Abx to prevent cholangitis  -VTE prophylaxis- SCDs, etc  -rash stable w/o change w narcotics/meds.  Benadryl helps.  Consider outpt Derm eval  -mobilize as tolerated to help recovery  I updated the patient's status to the patient & husband.  Also w Dr Penelope Coop with [facility].  Recommendations were made.  Questions were answered.  They expressed understanding & appreciation.   Adin Hector, M.D., F.A.C.S. Gastrointestinal and Minimally Invasive Surgery [facility] [address] [place], [place] [address] [phone] Main / Paging   03/17/2014   Results:   Labs: Results for orders placed or performed during the hospital encounter of 03/15/14 (from the past 48 hour(s))  Urinalysis, Routine w reflex microscopic     Status: Abnormal   Collection Time: 03/15/14 10:27 PM  Result Value Ref Range   Color, Urine YELLOW  YELLOW   APPearance Portman Degregory   Specific Gravity, Urine 1.017 1.005 - 1.030   pH 7.5 5.0 - 8.0   Glucose, UA NEGATIVE NEGATIVE mg/dL   Hgb urine dipstick NEGATIVE NEGATIVE   Bilirubin Urine NEGATIVE NEGATIVE   Ketones, ur NEGATIVE NEGATIVE mg/dL   Protein, ur NEGATIVE NEGATIVE mg/dL   Urobilinogen, UA 0.2 0.0 - 1.0 mg/dL   Nitrite NEGATIVE NEGATIVE   Leukocytes, UA SMALL (A) NEGATIVE  Urine microscopic-add on      Status: Abnormal   Collection Time: 03/15/14 10:27 PM  Result Value Ref Range   Squamous Epithelial / LPF RARE RARE   WBC, UA 3-6 <3 WBC/hpf   RBC / HPF 0-2 <3 RBC/hpf   Bacteria, UA FEW (A) RARE  CBC with Differential     Status: Abnormal   Collection Time: 03/15/14 11:21 PM  Result Value Ref Range   WBC 10.7 (H) 4.0 - 10.5 K/uL   RBC 4.94 3.87 - 5.11 MIL/uL   Hemoglobin 15.0 12.0 - 15.0 g/dL   HCT 45.0 36.0 - 46.0 %   MCV 91.1 78.0 - 100.0 fL   MCH 30.4 26.0 - 34.0 pg   MCHC 33.3 30.0 - 36.0 g/dL   RDW 13.7 11.5 - 15.5 %   Platelets 297 150 - 400 K/uL   Neutrophils Relative % 82 (H) 43 - 77 %   Neutro Abs 8.7 (H) 1.7 - 7.7 K/uL   Lymphocytes Relative 10 (L) 12 - 46 %   Lymphs Abs 1.1 0.7 - 4.0 K/uL   Monocytes Relative 7 3 - 12 %   Monocytes Absolute 0.8 0.1 - 1.0 K/uL   Eosinophils Relative 1 0 - 5 %   Eosinophils Absolute 0.1 0.0 - 0.7 K/uL   Basophils Relative 0 0 - 1 %   Basophils Absolute 0.0 0.0 - 0.1 K/uL  Comprehensive metabolic panel     Status: Abnormal   Collection Time: 03/15/14 11:22 PM  Result Value Ref Range   Sodium 138 135 - 145 mmol/L   Potassium 3.8 3.5 - 5.1 mmol/L   Chloride 104 96 - 112 mmol/L   CO2 26 19 - 32 mmol/L   Glucose, Bld 112 (H) 70 - 99 mg/dL   BUN 16 6 - 23 mg/dL   Creatinine, Ser 0.83 0.50 - 1.10 mg/dL   Calcium 9.4 8.4 - 10.5 mg/dL   Total Protein 6.7 6.0 - 8.3 g/dL   Albumin 4.2 3.5 - 5.2 g/dL   AST 180 (H) 0 - 37 U/L   ALT 132 (H) 0 - 35 U/L   Alkaline Phosphatase 66 39 - 117 U/L   Total Bilirubin 0.7 0.3 - 1.2 mg/dL   GFR calc non Af Amer 77 (L) >90 mL/min   GFR calc Af Amer 90 (L) >90 mL/min    Comment: (NOTE) The eGFR has been calculated using the CKD EPI equation. This calculation has not been validated in all clinical situations. eGFR's persistently <90 mL/min signify possible Chronic Kidney Disease.    Anion gap 8 5 - 15  Lipase, blood     Status: None   Collection Time: 03/15/14 11:22 PM  Result Value Ref Range    Lipase 51 11 - 59 U/L  Surgical pcr screen     Status: None   Collection Time: 03/16/14  4:33 AM  Result Value Ref Range   MRSA, PCR NEGATIVE NEGATIVE   Staphylococcus aureus NEGATIVE NEGATIVE    Comment:  The Xpert SA Assay (FDA approved for NASAL specimens in patients over 67 years of age), is one component of a comprehensive surveillance program.  Test performance has been validated by [facility] for patients greater than or equal to 78 year old. It is not intended to diagnose infection nor to guide or monitor treatment.   Comprehensive metabolic panel     Status: Abnormal   Collection Time: 03/17/14  5:55 AM  Result Value Ref Range   Sodium 135 135 - 145 mmol/L   Potassium 5.3 (H) 3.5 - 5.1 mmol/L    Comment: DELTA CHECK NOTED REPEATED TO VERIFY SLIGHT HEMOLYSIS    Chloride 102 96 - 112 mmol/L   CO2 28 19 - 32 mmol/L   Glucose, Bld 141 (H) 70 - 99 mg/dL   BUN 11 6 - 23 mg/dL   Creatinine, Ser 0.83 0.50 - 1.10 mg/dL   Calcium 9.1 8.4 - 10.5 mg/dL   Total Protein 6.6 6.0 - 8.3 g/dL   Albumin 3.7 3.5 - 5.2 g/dL   AST 713 (H) 0 - 37 U/L   ALT 1106 (H) 0 - 35 U/L   Alkaline Phosphatase 87 39 - 117 U/L   Total Bilirubin 2.0 (H) 0.3 - 1.2 mg/dL   GFR calc non Af Amer 77 (L) >90 mL/min   GFR calc Af Amer 90 (L) >90 mL/min    Comment: (NOTE) The eGFR has been calculated using the CKD EPI equation. This calculation has not been validated in all clinical situations. eGFR's persistently <90 mL/min signify possible Chronic Kidney Disease.    Anion gap 5 5 - 15  CBC     Status: Abnormal   Collection Time: 03/17/14  5:55 AM  Result Value Ref Range   WBC 11.4 (H) 4.0 - 10.5 K/uL   RBC 4.82 3.87 - 5.11 MIL/uL   Hemoglobin 14.4 12.0 - 15.0 g/dL   HCT 44.6 36.0 - 46.0 %   MCV 92.5 78.0 - 100.0 fL   MCH 29.9 26.0 - 34.0 pg   MCHC 32.3 30.0 - 36.0 g/dL   RDW 13.9 11.5 - 15.5 %   Platelets 283 150 - 400 K/uL    Imaging / Studies: Dg Cholangiogram  Operative  03/16/2014   CLINICAL DATA:  Cholelithiasis, cholecystitis  EXAM: INTRAOPERATIVE CHOLANGIOGRAM  TECHNIQUE: Cholangiographic images from the C-arm fluoroscopic device were submitted for interpretation post-operatively. Please see the procedural report for the amount of contrast and the fluoroscopy time utilized.  COMPARISON:  None.  FINDINGS: There is a meniscus in the distal common bile duct with no contrast passage distally into the duodenum. No mobile filling defects in the common duct. Intrahepatic ducts are incompletely visualized, appearing decompressed centrally.  : 1. Distal CBD obstruction suggesting retained calculus.   Electronically Signed   By: Arne Cleveland M.D.   On: 03/16/2014 08:47   US Abdomen Complete  03/16/2014   CLINICAL DATA:  Right upper quadrant pain  EXAM: ULTRASOUND ABDOMEN COMPLETE  COMPARISON:  None.  FINDINGS: Gallbladder: Cholelithiasis without pericholecystic fluid or gallbladder wall thickening. Ring down artifact from the gallbladder wall as can be seen with adenomyomatosis. Positive sonographic Murphy sign.  Common bile duct: Diameter: 3.6 mm  Liver: No focal lesion identified. Increased hepatic parenchymal echogenicity.  IVC: No abnormality visualized.  Pancreas: Visualized portion unremarkable.  Spleen: Size and appearance within normal limits.  Right Kidney: Length: 10.6 cm. Echogenicity within normal limits. No mass or hydronephrosis visualized.  Left Kidney: Length: 11.7 cm. Echogenicity  within normal limits. No mass or hydronephrosis visualized.  Abdominal aorta: No aneurysm visualized.  Other findings: None.  IMPRESSION: 1. Cholelithiasis without sonographic evidence of acute cholecystitis. 2. Increased hepatic parenchymal echogenicity as can be seen with hepatic steatosis.   Electronically Signed   By: Kathreen Devoid   On: 03/16/2014 01:03    Medications / Allergies: per chart  Antibiotics: Anti-infectives    Start     Dose/Rate Route Frequency Ordered  Stop   03/16/14 0400  cefTRIAXone (ROCEPHIN) 2 g in dextrose 5 % 50 mL IVPB    Comments:  Pharmacy may adjust dosing strength / duration / interval for maximal efficacy   2 g100 mL/hr over 30 Minutes Intravenous Every 24 hours 03/16/14 0313         Note: Portions of this report may have been transcribed using voice recognition software. Every effort was made to ensure accuracy; however, inadvertent computerized transcription errors may be present.   Any transcriptional errors that result from this process are unintentional.

## 2014-03-18 NOTE — Progress Notes (Signed)
ERCP done, fairly large stone removed after sphincterotomy no immediate complications. We'll observe the trend in LFTs and hopefully improvement in her pain.

## 2014-03-18 NOTE — Interval H&P Note (Signed)
History and Physical Interval Note:  03/18/2014 7:31 AM  Ann Hill  has presented today for surgery, with the diagnosis of cbd stones  The various methods of treatment have been discussed with the patient and family. After consideration of risks, benefits and other options for treatment, the patient has consented to  Procedure(s): ENDOSCOPIC RETROGRADE CHOLANGIOPANCREATOGRAPHY (ERCP) WITH PROPOFOL (N/A) as a surgical intervention .  The patient's history has been reviewed, patient examined, no change in status, stable for surgery.  I have reviewed the patient's chart and labs.  Questions were answered to the patient's satisfaction.     [name] C

## 2014-03-18 NOTE — Anesthesia Procedure Notes (Signed)
Procedure Name: Intubation Date/Time: 03/18/2014 7:39 AM Performed by: Danley Danker L Patient Re-evaluated:Patient Re-evaluated prior to inductionOxygen Delivery Method: Circle system utilized Preoxygenation: Pre-oxygenation with 100% oxygen Intubation Type: IV induction Ventilation: Mask ventilation without difficulty Laryngoscope Size: Miller and 2 Grade View: Grade I Tube type: Oral Tube size: 7.5 mm Number of attempts: 1 Airway Equipment and Method: Stylet Placement Confirmation: ETT inserted through vocal cords under direct vision,  breath sounds checked- equal and bilateral and positive ETCO2 Secured at: 21 cm Tube secured with: Tape Dental Injury: Teeth and Oropharynx as per pre-operative assessment

## 2014-03-19 ENCOUNTER — Encounter (HOSPITAL_COMMUNITY): Payer: Self-pay | Admitting: Gastroenterology

## 2014-03-19 LAB — CBC
HCT: 41.3 % (ref 36.0–46.0)
HEMOGLOBIN: 13.2 g/dL (ref 12.0–15.0)
MCH: 29.7 pg (ref 26.0–34.0)
MCHC: 32 g/dL (ref 30.0–36.0)
MCV: 93 fL (ref 78.0–100.0)
PLATELETS: 206 10*3/uL (ref 150–400)
RBC: 4.44 MIL/uL (ref 3.87–5.11)
RDW: 14.3 % (ref 11.5–15.5)
WBC: 6.6 10*3/uL (ref 4.0–10.5)

## 2014-03-19 LAB — COMPREHENSIVE METABOLIC PANEL
ALBUMIN: 3 g/dL — AB (ref 3.5–5.2)
ALK PHOS: 111 U/L (ref 39–117)
ALT: 572 U/L — AB (ref 0–35)
ANION GAP: 9 (ref 5–15)
AST: 167 U/L — AB (ref 0–37)
BUN: 11 mg/dL (ref 6–23)
CO2: 25 mmol/L (ref 19–32)
Calcium: 8.1 mg/dL — ABNORMAL LOW (ref 8.4–10.5)
Chloride: 102 mmol/L (ref 96–112)
Creatinine, Ser: 0.69 mg/dL (ref 0.50–1.10)
GFR calc non Af Amer: >90 mL/min (ref >90)
Glucose, Bld: 73 mg/dL (ref 70–99)
Potassium: 3.8 mmol/L (ref 3.5–5.1)
Sodium: 136 mmol/L (ref 135–145)
Total Bilirubin: 2.4 mg/dL — ABNORMAL HIGH (ref 0.3–1.2)
Total Protein: 5.5 g/dL — ABNORMAL LOW (ref 6.0–8.3)

## 2014-03-19 LAB — LIPASE, BLOOD: LIPASE: 28 U/L (ref 11–59)

## 2014-03-19 MED ORDER — ACETAMINOPHEN 325 MG PO TABS: 650.0000 mg | ORAL_TABLET | Freq: Four times a day (QID) | ORAL | Status: DC | PRN

## 2014-03-19 NOTE — Discharge Summary (Signed)
Physician Discharge Summary  Patient ID: Ann Hill MRN: [id number] DOB/AGE: 05-02-1955 59 y.o.  Admit date: 03/15/2014 Discharge date: 03/19/2014  Admission Diagnoses:  Calculus of gallbladder with acute cholecystitis   Discharge Diagnoses:  ACUTE CHOLECYSTITIS, WITH COMMON BILE DUCT OBSTRUCTION Principal Problem:   Common bile duct (CBD) obstruction, probable stone Active Problems:   Acute calculous cholecystitis s/p lap chole w IOC 03/16/2014   Rash of entire body since October 2015   PROCEDURES:  1.  S/P SINGLE SITE LAPAROSCOPIC CHOLECYSTECTOMY WITH INTRAOPERATIVE CHOLANGIOGRAM, Michael Boston, MD. 03/16/14 2.  ERCP with sphincterotomy and stone extraction, 03/18/2014 , DR. [facility] Course:  Pleasant overweight female that is struggled with intermittent attacks of abdominal pain. Worse attacks of woke her up in the middle the night. She thinks is related to eating. Most attacks within the past week. Took some antiacid meds with one attack. That initially seem to help. However she got attack again. She did a lot of research herself. She is concerned it may be her gallbladder. She had been trying to switch to a low-fat diet. Started having some pain and early satiety after having some hummus and pretzels yesterday afternoon. Then a couple hours after eating her meal pain became more intense. She had salad with cottage cheese and chicken. Has been focused in the right upper quadrant. Radiates to her back. No major nausea or vomiting. Some fullness. She came to emergency room 5 hours ago when the pain persisted. Initial physical exam showed Murphy sign. Improved after getting some IV pain medication narcotics. Pain is down but not totally gone. Sonographic Murphy sign with gallstones. Suspicious for early cholecystitis.  She is moderately active. She can walk half hour without difficulty. No abdominal surgeries. No personal nor family history of GI/colon  cancer, inflammatory bowel disease, irritable bowel syndrome, allergy such as Celiac Sprue, dietary/dairy problems, colitis, ulcers nor gastritis. No recent sick contacts/gastroenteritis. No travel outside the country. No changes in diet. No dysphagia to solids or liquids. No significant heartburn or reflux. No hematochezia, hematemesis, coffee ground emesis. No evidence of prior gastric/peptic ulceration. She has never had a colonoscopy. She says she will never get one for personal reasons. Apparently her husband had problems with one.  She was admitted and taken to the OR for surgery.  IOC was suspicious for distal common bile duct stone with obstruction.  She was seen by Dr. Penelope Coop, who recommended ERCP.  This was done on, 03/18/14.  Post procedures she is still sore.  LFT's are improving.  If she can eat and ambulate comfortably we will let her go home later today.  She will follow up with our office and get LFT'S repeated before returning to our office.  CMP Latest Ref Rng 03/19/2014 03/17/2014 03/15/2014  Glucose 70 - 99 mg/dL 73 141(H) 112(H)  BUN 6 - 23 mg/dL 11 11 16   Creatinine 0.50 - 1.10 mg/dL 0.69 0.83 0.83  Sodium 135 - 145 mmol/L 136 135 138  Potassium 3.5 - 5.1 mmol/L 3.8 5.3(H) 3.8  Chloride 96 - 112 mmol/L 102 102 104  CO2 19 - 32 mmol/L 25 28 26   Calcium 8.4 - 10.5 mg/dL 8.1(L) 9.1 9.4  Total Protein 6.0 - 8.3 g/dL 5.5(L) 6.6 6.7  Total Bilirubin 0.3 - 1.2 mg/dL 2.4(H) 2.0(H) 0.7  Alkaline Phos 39 - 117 U/L 111 87 66  AST 0 - 37 U/L 167(H) 713(H) 180(H)  ALT 0 - 35 U/L 572(H) 1106(H) 132(H)   CBC  Component Value Date/Time   WBC 6.6 03/19/2014 0515   RBC 4.44 03/19/2014 0515   HGB 13.2 03/19/2014 0515   HCT 41.3 03/19/2014 0515   PLT 206 03/19/2014 0515   MCV 93.0 03/19/2014 0515   MCH 29.7 03/19/2014 0515   MCHC 32.0 03/19/2014 0515   RDW 14.3 03/19/2014 0515   LYMPHSABS 1.1 03/15/2014 2321   MONOABS 0.8 03/15/2014 2321   EOSABS 0.1 03/15/2014 2321    BASOSABS 0.0 03/15/2014 2321       Condition on D/c:  Improved    Disposition: discharged home  Discharge Instructions    Call MD for:  extreme fatigue    Complete by:  As directed      Call MD for:  hives    Complete by:  As directed      Call MD for:  persistant nausea and vomiting    Complete by:  As directed      Call MD for:  redness, tenderness, or signs of infection (pain, swelling, redness, odor or green/yellow discharge around incision site)    Complete by:  As directed      Call MD for:  severe uncontrolled pain    Complete by:  As directed      Call MD for:    Complete by:  As directed   Temperature > 101.61F     Diet - low sodium heart healthy    Complete by:  As directed      Discharge instructions    Complete by:  As directed   Please see discharge instruction sheets.  Also refer to handout given an office.  Please call our office if you have any questions or concerns [phone]     Discharge wound care:    Complete by:  As directed   If you have closed incisions, shower and bathe over these incisions with soap and water every day.  Remove all surgical dressings on postoperative day #3.  You do not need to replace dressings over the closed incisions unless you feel more comfortable with a Band-Aid covering it.   If you have an open wound that requires packing, please see wound care instructions.  In general, remove all dressings, wash wound with soap and water and then replace with saline moistened gauze.  Do the dressing change at least every day.  Please call our office [phone] if you have further questions.     Driving Restrictions    Complete by:  As directed   No driving until off narcotics and can safely swerve away without pain during an emergency     Increase activity slowly    Complete by:  As directed   Walk an hour a day.  Use 20-30 minute walks.  When you can walk 30 minutes without difficulty, increase to low impact/moderate activities such as  biking, jogging, swimming, sexual activity..  Eventually can increase to unrestricted activity when not feeling pain.  If you feel pain: STOP!Marland Kitchen   Let pain protect you from overdoing it.  Use ice/heat/over-the-counter pain medications to help minimize his soreness.  Use pain prescriptions as needed to remain active.  It is better to take extra pain medications and be more active than to stay bedridden to avoid all pain medications.     Lifting restrictions    Complete by:  As directed   Avoid heavy lifting initially.  Do not push through pain.  You have no specific weight limit.  Coughing and sneezing or four  more stressful to your incision than any lifting you will do. Pain will protect you from injury.  Therefore, avoid intense activity until off all narcotic pain medications.  Coughing and sneezing or four more stressful to your incision than any lifting he will do.     May shower / Bathe    Complete by:  As directed      May walk up steps    Complete by:  As directed      Sexual Activity Restrictions    Complete by:  As directed   Sexual activity as tolerated.  Do not push through pain.  Pain will protect you from injury.     Walk with assistance    Complete by:  As directed   Walk over an hour a day.  May use a walker/cane/companion to help with balance and stamina.            Medication List    TAKE these medications        acetaminophen 325 MG tablet  Commonly known as:  TYLENOL  Take 2 tablets (650 mg total) by mouth every 6 (six) hours as needed for mild pain, moderate pain, fever or headache.     diphenhydrAMINE 25 MG tablet  Commonly known as:  BENADRYL  Take 25 mg by mouth every 6 (six) hours as needed.     ibuprofen 200 MG tablet  Commonly known as:  MOTRIN IB  You can take 2-3 tablets every 6 hours as needed for pain.  I would use this first.     loratadine 10 MG tablet  Commonly known as:  CLARITIN  Take 10 mg by mouth daily.     oxyCODONE 5 MG immediate release  tablet  Commonly known as:  Oxy IR/ROXICODONE  Take 1-2 tablets (5-10 mg total) by mouth every 4 (four) hours as needed for moderate pain, severe pain or breakthrough pain.           Follow-up Information    Follow up with [facility] [place] On 04/09/2014.   Why:  You have an appointment at 2:45, be at the office for check in 30 minutes before that.   Contact information:   [place] [address] [phone]      Follow up with Lear Ng., MD. Schedule an appointment as soon as possible for a visit in 1 week.   Specialty:  Gastroenterology   Why:  To check your liver function studies   Contact information:   [address], [place] [address] [phone]       Signed: Earnstine Regal 03/19/2014, 8:56 AM

## 2014-03-19 NOTE — Progress Notes (Signed)
Patient ID: Ann Hill, female   DOB: 1955-12-08, 59 y.o.   MRN: [id number] [facility] Gastroenterology Progress Note  Ann Hill 59 y.o. 12-29-1955   Subjective: Feels a lot better this morning. Denies abdominal pain. Had pain across her upper abdomen yesterday that has resolved. Wants to eat and wants to go home.  Objective: Vital signs in last 24 hours: Filed Vitals:   03/19/14 0525  BP: 141/69  Pulse: 85  Temp: 98.7 F (37.1 C)  Resp: 18    Physical Exam: Gen: alert, no acute distress Abd: minimal epigastric tenderness with guarding, LLQ tenderness with voluntary guarding, soft, nondistended, obese, +BS  Lab Results:  Recent Labs  03/17/14 0555 03/19/14 0515  NA 135 136  K 5.3* 3.8  CL 102 102  CO2 28 25  GLUCOSE 141* 73  BUN 11 11  CREATININE 0.83 0.69  CALCIUM 9.1 8.1*    Recent Labs  03/17/14 0555 03/19/14 0515  AST 713* 167*  ALT 1106* 572*  ALKPHOS 87 111  BILITOT 2.0* 2.4*  PROT 6.6 5.5*  ALBUMIN 3.7 3.0*    Recent Labs  03/17/14 0555 03/19/14 0515  WBC 11.4* 6.6  HGB 14.4 13.2  HCT 44.6 41.3  MCV 92.5 93.0  PLT 283 206   No results for input(s): LABPROT, INR in the last 72 hours.    Assessment/Plan: CBD stone removed on ERCP yesterday. LFTs improving. Clinically stable. Suspect abdominal pain will improve today. If she can eat today, then ok to go home later today and f/u with Dr. Amedeo Hill in 2-3 weeks and need to recheck LFTs next week.   Raymer C. 03/19/2014, 8:51 AM

## 2014-03-19 NOTE — Progress Notes (Signed)
Nurse reviewed discharge instructions with pt.  Pt verbalized understanding of discharge instructions, follow up appointments and new medications.  Pt also is aware of lab work she needs to have drawn prior to follow up appointment.  No concerns at time of discharge.

## 2014-03-19 NOTE — Progress Notes (Signed)
1 Day Post-Op  Subjective: Ann Hill is feeling better, Ann Hill had some gas with eating yesterday, has not had breakfast yet, still waiting.  Objective: Vital signs in last 24 hours: Temp:  [97.8 F (36.6 C)-98.9 F (37.2 C)] 98.7 F (37.1 C) (02/02 0525) Pulse Rate:  [64-85] 85 (02/02 0525) Resp:  [11-18] 18 (02/02 0525) BP: (121-141)/(63-80) 141/69 mmHg (02/02 0525) SpO2:  [96 %-100 %] 97 % (02/02 0525)  780 PO Afebrile, VSS Transaminase,  improving T Bil is up some.  WBC is normal. ERCP: Limited images from ERCP and sphincterotomy demonstrate partial opacification of the extrahepatic ductal system. No definite filling defect identified on the study.  Recent Labs Lab 03/15/14 2322 03/17/14 0555 03/19/14 0515  AST 180* 713* 167*  ALT 132* 1106* 572*  ALKPHOS 66 87 111  BILITOT 0.7 2.0* 2.4*  PROT 6.7 6.6 5.5*  ALBUMIN 4.2 3.7 3.0*   Intake/Output from previous day: 02/01 0701 - 02/02 0700 In: 3849.5 [P.O.:780; I.V.:3019.5; IV Piggyback:50] Out: 2300 [Urine:2300] Intake/Output this shift:    General appearance: alert, cooperative and no distress GI: soft sore, site looks fine.   Lab Results:   Recent Labs  03/17/14 0555 03/19/14 0515  WBC 11.4* 6.6  HGB 14.4 13.2  HCT 44.6 41.3  PLT 283 206    BMET  Recent Labs  03/17/14 0555 03/19/14 0515  NA 135 136  K 5.3* 3.8  CL 102 102  CO2 28 25  GLUCOSE 141* 73  BUN 11 11  CREATININE 0.83 0.69  CALCIUM 9.1 8.1*   PT/INR No results for input(s): LABPROT, INR in the last 72 hours.   Recent Labs Lab 03/15/14 2322 03/17/14 0555 03/19/14 0515  AST 180* 713* 167*  ALT 132* 1106* 572*  ALKPHOS 66 87 111  BILITOT 0.7 2.0* 2.4*  PROT 6.7 6.6 5.5*  ALBUMIN 4.2 3.7 3.0*     Lipase     Component Value Date/Time   LIPASE 28 03/19/2014 0515     Studies/Results: Dg Ercp Biliary & Pancreatic Ducts  03/18/2014   CLINICAL DATA:  59 year old female with ERCP and evidence of biliary stone.  EXAM: ERCP   COMPARISON:  Intraoperative cholangiogram 03/16/2014  FINDINGS: Intraoperative fluoroscopic spot images of the upper abdomen during ERCP.  Images demonstrate the endoscope within the proximal duodenum, and cannulation of the ampulla.  Guidewire present within the common bile duct and cystic duct. Surgical clips in the region of the previous cholecystectomy.  Retrograde infusion of contrast partially opacifies the common bile duct, common hepatic duct, and the distal cystic duct. Minimal contrast within the intrahepatic ducts at the liver hilum.  No definite filling defects identified.  IMPRESSION: Limited images from ERCP and sphincterotomy demonstrate partial opacification of the extrahepatic ductal system. No definite filling defect identified on the study.  Please refer to the dictated operative report for full details of intraoperative findings and procedure.  Signed,  Dulcy Fanny. Earleen [place], DO  [facility]  [facility]   Electronically Signed   By: Corrie Mckusick D.O.   On: 03/18/2014 11:15    Medications: . acetaminophen  1,000 mg Oral TID  . cefTRIAXone (ROCEPHIN)  IV  2 g Intravenous Q24H  . chlorhexidine  1 application Topical Once  . chlorhexidine  1 application Topical Once  . heparin  5,000 Units Subcutaneous 3 times per day  . ketorolac  15-30 mg Intravenous 4 times per day  . lactated ringers  1,000 mL Intravenous Once  .  lip balm  1 application Topical BID  . loratadine  10 mg Oral Daily  . polyethylene glycol  17 g Oral BID  . saccharomyces boulardii  250 mg Oral BID    Assessment/Plan ACUTE CHOLECYSTITIS, WITH COMMON BILE DUCT OBSTRUCTION S/P SINGLE SITE LAPAROSCOPIC CHOLECYSTECTOMY WITH INTRAOPERATIVE CHOLANGIOGRAM, Michael Boston, MD. 03/16/14 ERCP with sphincterotomy and stone extraction, 03/18/2014 , dR. JOHN HAYES   Plan:  Ambulate, let her eat, Let Gi review labs and hope to send home later today.      LOS: 4 days     Ann Hill 03/19/2014

## 2014-03-19 NOTE — Discharge Instructions (Signed)
LAPAROSCOPIC SURGERY: POST OP INSTRUCTIONS ° °1. DIET: Follow a light bland diet the first 24 hours after arrival home, such as soup, liquids, crackers, etc.  Be sure to include lots of fluids daily.  Avoid fast food or heavy meals as your are more likely to get nauseated.  Eat a low fat the next few days after surgery.   °2. Take your usually prescribed home medications unless otherwise directed. °3. PAIN CONTROL: °a. Pain is best controlled by a usual combination of three different methods TOGETHER: °i. Ice/Heat °ii. Over the counter pain medication °iii. Prescription pain medication °b. Most patients will experience some swelling and bruising around the incisions.  Ice packs or heating pads (30-60 minutes up to 6 times a day) will help. Use ice for the first few days to help decrease swelling and bruising, then switch to heat to help relax tight/sore spots and speed recovery.  Some people prefer to use ice alone, heat alone, alternating between ice & heat.  Experiment to what works for you.  Swelling and bruising can take several weeks to resolve.   °c. It is helpful to take an over-the-counter pain medication regularly for the first few weeks.  Choose one of the following that works best for you: °i. Naproxen (Aleve, etc)  Two 220mg tabs twice a day °ii. Ibuprofen (Advil, etc) Three 200mg tabs four times a day (every meal & bedtime) °iii. Acetaminophen (Tylenol, etc) 500-650mg four times a day (every meal & bedtime) °d. A  prescription for pain medication (such as oxycodone, hydrocodone, etc) should be given to you upon discharge.  Take your pain medication as prescribed.  °i. If you are having problems/concerns with the prescription medicine (does not control pain, nausea, vomiting, rash, itching, etc), please call us [phone] to see if we need to switch you to a different pain medicine that will work better for you and/or control your side effect better. °ii. If you need a refill on your pain medication,  please contact your pharmacy.  They will contact our office to request authorization. Prescriptions will not be filled after 5 pm or on week-ends. °4. Avoid getting constipated.  Between the surgery and the pain medications, it is common to experience some constipation.  Increasing fluid intake and taking a fiber supplement (such as Metamucil, Citrucel, FiberCon, MiraLax, etc) 1-2 times a day regularly will usually help prevent this problem from occurring.  A mild laxative (prune juice, Milk of Magnesia, MiraLax, etc) should be taken according to package directions if there are no bowel movements after 48 hours.   °5. Watch out for diarrhea.  If you have many loose bowel movements, simplify your diet to bland foods & liquids for a few days.  Stop any stool softeners and decrease your fiber supplement.  Switching to mild anti-diarrheal medications (Kayopectate, Pepto Bismol) can help.  If this worsens or does not improve, please call us. °6. Wash / shower every day.  You may shower over the dressings as they are waterproof.  Continue to shower over incision(s) after the dressing is off. °7. Remove your waterproof bandages 5 days after surgery.  You may leave the incision open to air.  You may replace a dressing/Band-Aid to cover the incision for comfort if you wish.  °8. ACTIVITIES as tolerated:   °a. You may resume regular (light) daily activities beginning the next day--such as daily self-care, walking, climbing stairs--gradually increasing activities as tolerated.  If you can walk 30 minutes without difficulty, it   is safe to try more intense activity such as jogging, treadmill, bicycling, low-impact aerobics, swimming, etc. b. Save the most intensive and strenuous activity for last such as sit-ups, heavy lifting, contact sports, etc  Refrain from any heavy lifting or straining until you are off narcotics for pain control.   c. DO NOT PUSH THROUGH PAIN.  Let pain be your guide: If it hurts to do something, don't  do it.  Pain is your body warning you to avoid that activity for another week until the pain goes down. d. You may drive when you are no longer taking prescription pain medication, you can comfortably wear a seatbelt, and you can safely maneuver your car and apply brakes. e. Dennis Bast may have sexual intercourse when it is comfortable.  9. FOLLOW UP in our office a. Please call [facility] at [phone] to set up an appointment to see your surgeon in the office for a follow-up appointment approximately 2-3 weeks after your surgery. b. Make sure that you call for this appointment the day you arrive home to insure a convenient appointment time. 10. IF YOU HAVE DISABILITY OR FAMILY LEAVE FORMS, BRING THEM TO THE OFFICE FOR PROCESSING.  DO NOT GIVE THEM TO YOUR DOCTOR.   WHEN TO CALL us [phone]: 1. Poor pain control 2. Reactions / problems with new medications (rash/itching, nausea, etc)  3. Fever over 101.5 F (38.5 C) 4. Inability to urinate 5. Nausea and/or vomiting 6. Worsening swelling or bruising 7. Continued bleeding from incision. 8. Increased pain, redness, or drainage from the incision   The clinic staff is available to answer your questions during regular business hours (8:30am-5pm).  Please dont hesitate to call and ask to speak to one of our nurses for clinical concerns.   If you have a medical emergency, go to the nearest emergency room or call 911.  A surgeon from [facility] is always on call at the [facility], [place], [place], [place],   [address] ? MAIN: [phone] ? TOLL FREE: [phone] ?  FAX [phone] [email or web address]  Cholecystitis Cholecystitis is an inflammation of your gallbladder. It is usually caused by a buildup of gallstones or sludge (cholelithiasis) in your gallbladder. The gallbladder stores a fluid that helps digest fats (bile). Cholecystitis is serious and needs  treatment right away.  CAUSES   Gallstones. Gallstones can block the tube that leads to your gallbladder, causing bile to build up. As bile builds up, the gallbladder becomes inflamed.  Bile duct problems, such as blockage from scarring or kinking.  Tumors. Tumors can stop bile from leaving your gallbladder correctly, causing bile to build up. As bile builds up, the gallbladder becomes inflamed. SYMPTOMS   Nausea.  Vomiting.  Abdominal pain, especially in the upper right area of your abdomen.  Abdominal tenderness or bloating.  Sweating.  Chills.  Fever.  Yellowing of the skin and the whites of the eyes (jaundice). DIAGNOSIS  Your caregiver may order blood tests to look for infection or gallbladder problems. Your caregiver may also order imaging tests, such as an ultrasound or computed tomography (CT) scan. Further tests may include a hepatobiliary iminodiacetic acid (HIDA) scan. This scan allows your caregiver to see your bile move from the liver to the gallbladder and to the small intestine. TREATMENT  A hospital stay is usually necessary to lessen the inflammation of your gallbladder. You may be required to not eat or drink (fast) for a  certain amount of time. You may be given medicine to treat pain or an antibiotic medicine to treat an infection. Surgery may be needed to remove your gallbladder (cholecystectomy) once the inflammation has gone down. Surgery may be needed right away if you develop complications such as death of gallbladder tissue (gangrene) or a tear (perforation) of the gallbladder.  [place] care will depend on your treatment. In general:  If you were given antibiotics, take them as directed. Finish them even if you start to feel better.  Only take over-the-counter or prescription medicines for pain, discomfort, or fever as directed by your caregiver.  Follow a low-fat diet until you see your caregiver again.  Keep all follow-up visits as  directed by your caregiver. SEEK IMMEDIATE MEDICAL CARE IF:   Your pain is increasing and not controlled by medicines.  Your pain moves to another part of your abdomen or to your back.  You have a fever.  You have nausea and vomiting. MAKE SURE YOU:  Understand these instructions.  Will watch your condition.  Will get help right away if you are not doing well or get worse. Document Released: 02/01/2005 Document Revised: 04/26/2011 Document Reviewed: 12/18/2010 [facility] Patient Information 2015 [place], [place]. This information is not intended to replace advice given to you by your health care provider. Make sure you discuss any questions you have with your health care provider.  Endoscopic Retrograde Cholangiopancreatography (ERCP) Endoscopic retrograde cholangiopancreatography (ERCP) is a procedure used to diagnosis many diseases of the pancreas, bile ducts, liver, and gallbladder. During ERCP a thin, lighted tube (endoscope) is passed through the mouth and down the back of the throat into the first part of the small intestine (duodenum). A small, plastic tube (cannula) is then passed through the endoscope and directed into the bile duct or pancreatic duct. Dye is then injected through the cannula and X-rays are taken to study the biliary and pancreatic passageways.  LET [facility] CARE PROVIDER KNOW ABOUT:   Any allergies you have.   All medicines you are taking, including vitamins, herbs, eyedrops, creams, and over-the-counter medicines.   Previous problems you or members of your family have had with the use of anesthetics.   Any blood disorders you have.   Previous surgeries you have had.   Medical conditions you have. RISKS AND COMPLICATIONS Generally, ERCP is a safe procedure. However, as with any procedure, complications can occur. A simple removal of gallstones has the lowest rate of complications. Higher rates of complication occur in people who have poorly  functioning bile or pancreatic ducts. Possible complications include:   Pancreatitis.  Bleeding.  Accidental punctures in the bowel wall, pancreas, or gall bladder.  Gall bladder or bile duct infection. BEFORE THE PROCEDURE   Do not eat or drink anything, including water, for at least 8 hours before the procedure or as directed by your health care provider.   Ask your health care provider whether you should stop taking certain medicines prior to your procedure.   Arrange for someone to drive you home. You will not be allowed to drive for 99-37 hours after the procedure. PROCEDURE   You will be given medicine through a vein (intravenously) to make you relaxed and sleepy.   You might have a breathing tube placed to give you medicine that makes you sleep (general anesthetic).   Your throat may be sprayed with medicine that numbs the area and prevents gagging (local anesthetic), or you may gargle this medicine.  You will lie on your left side.   The endoscope will be inserted through your mouth and into the duodenum. The tube will not interfere with your breathing. Gagging is prevented by the anesthesia.   While X-rays are being taken, you may be positioned on your stomach.   A small sample of tissue (biopsy) may be removed for examination. AFTER THE PROCEDURE   You will rest in bed until you are fully conscious.   When you first wake up, your throat may feel slightly sore.   You will not be allowed to eat or drink until numbness subsides.   Once you are able to drink, urinate, and sit on the edge of the bed without feeling sick to your stomach (nauseous) or dizzy, you may be allowed to go home. Document Released: 10/27/2000 Document Revised: 11/22/2012 Document Reviewed: 09/12/2012 [facility] Patient Information 2015 [place], [place]. This information is not intended to replace advice given to you by your health care provider. Make sure you discuss any questions you  have with your health care provider.  Endoscopic Retrograde Cholangiopancreatography (ERCP), Care After Refer to this sheet in the next few weeks. These instructions provide you with information on caring for yourself after your procedure. Your health care provider may also give you more specific instructions. Your treatment has been planned according to current medical practices, but problems sometimes occur. Call your health care provider if you have any problems or questions after your procedure.  WHAT TO EXPECT AFTER THE PROCEDURE  After your procedure, it is typical to feel:   Soreness in your throat.   Sick to your stomach (nauseous).   Bloated.  Dizzy.   Fatigued. HOME CARE INSTRUCTIONS  Have a friend or family member stay with you for the first 24 hours after your procedure.  Start taking your usual medicines and eating normally as soon as you feel well enough to do so or as directed by your health care provider. SEEK MEDICAL CARE IF:  You have abdominal pain.   You develop signs of infection, such as:   Chills.   Feeling unwell.  SEEK IMMEDIATE MEDICAL CARE IF:  You have difficulty swallowing.  You have worsening throat, chest, or abdominal pain.  You vomit.  You have bloody or very black stools.  You have a fever. Document Released: 11/22/2012 Document Reviewed: 11/22/2012 [facility] Patient Information 2015 [place], [place]. This information is not intended to replace advice given to you by your health care provider. Make sure you discuss any questions you have with your health care provider.

## 2014-03-19 NOTE — Progress Notes (Signed)
GI addendum: As long as she has follow-up appointment with her surgeon she does not need GI follow-up in my office.

## 2016-02-26 IMAGING — US US ABDOMEN COMPLETE
1 series · 14 of 25 positions shown · non-contrast
Comparison: None.

CLINICAL DATA: Right upper quadrant pain

EXAM:
ULTRASOUND ABDOMEN COMPLETE

[Series 1: us abdomen complete · 0.24mm/px · 14 of 105 slices shown]
[im 1/105]
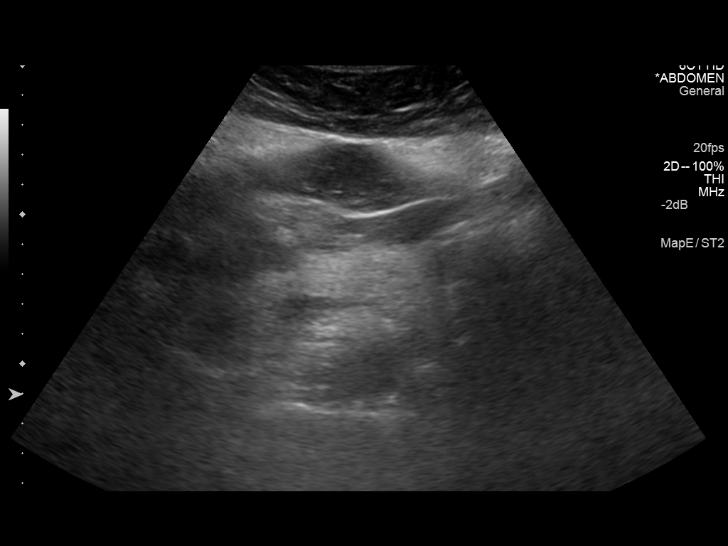
[im 9/105]
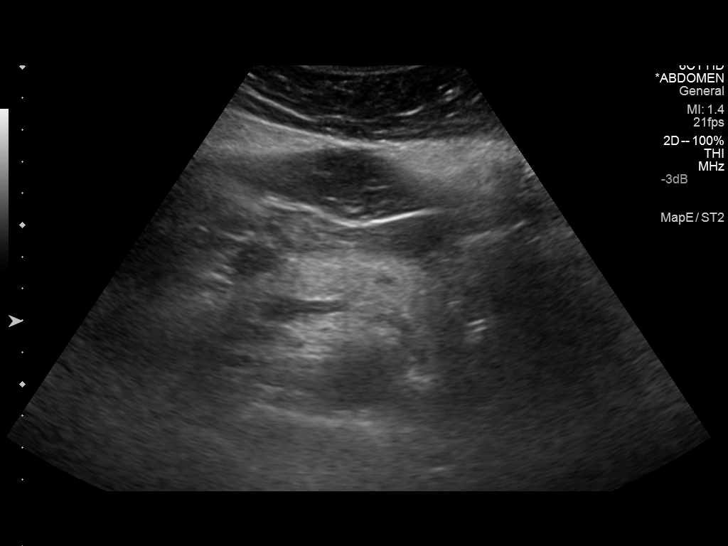
[im 18/105]
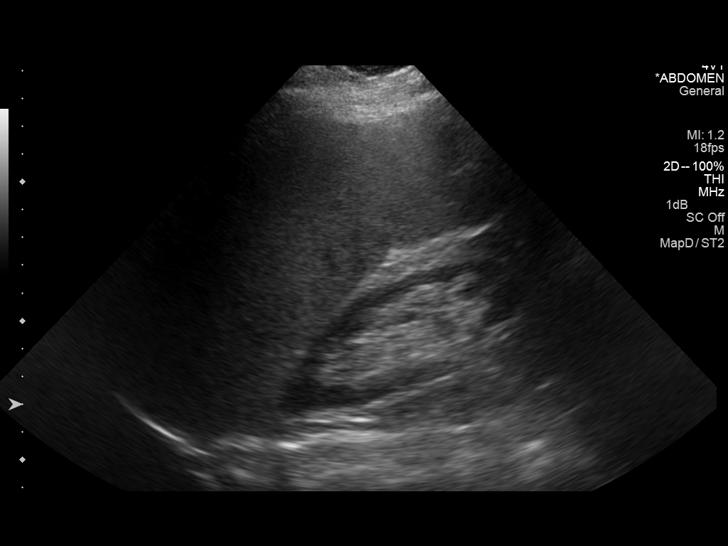
[im 27/105]
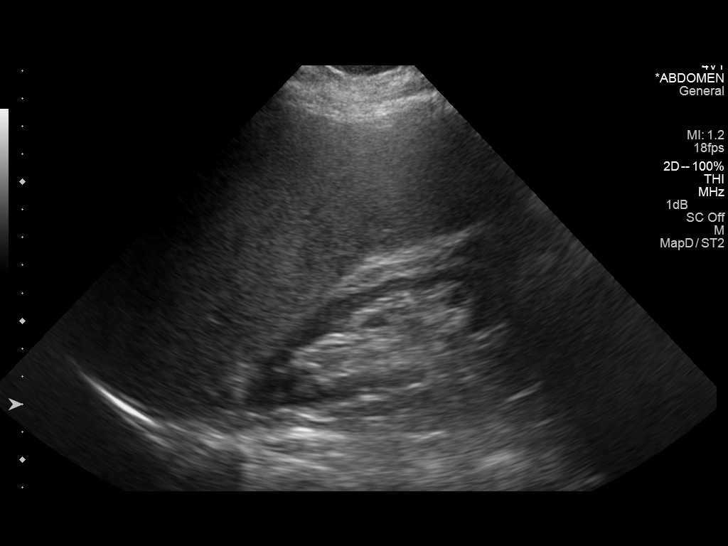
[im 35/105]
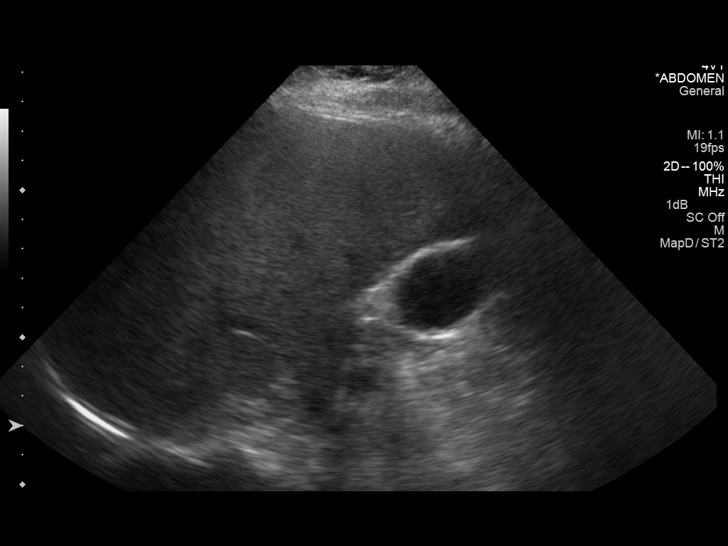
[im 40/105]
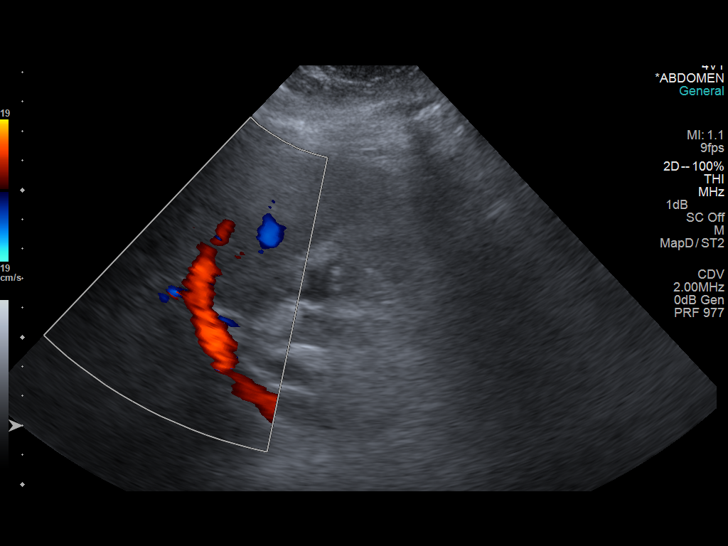
[im 48/105]
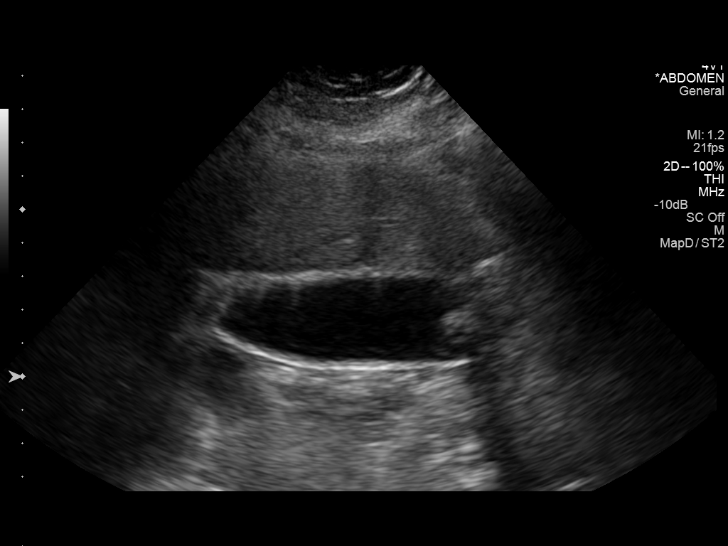
[im 57/105]
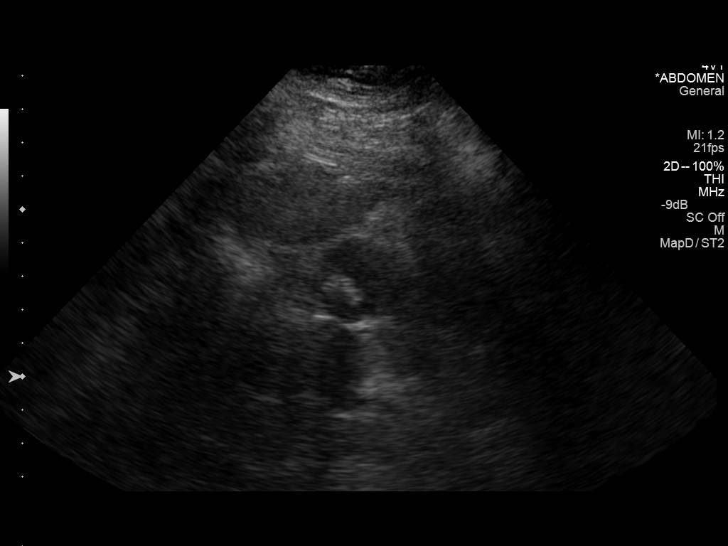
[im 66/105]
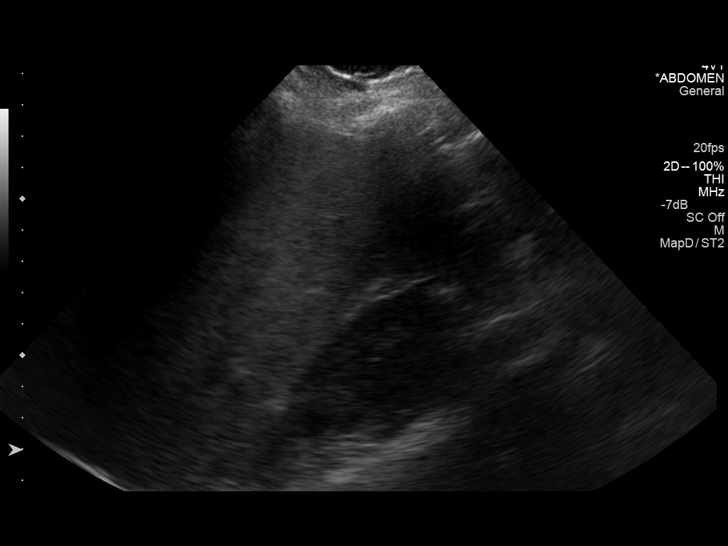
[im 70/105]
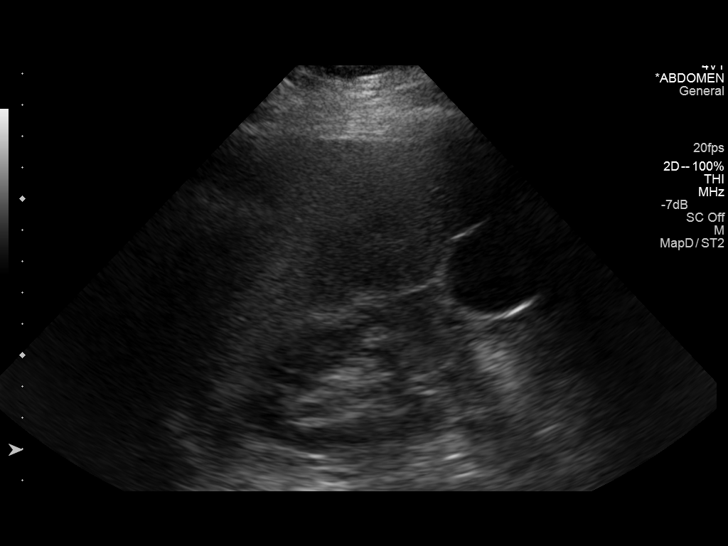
[im 79/105]
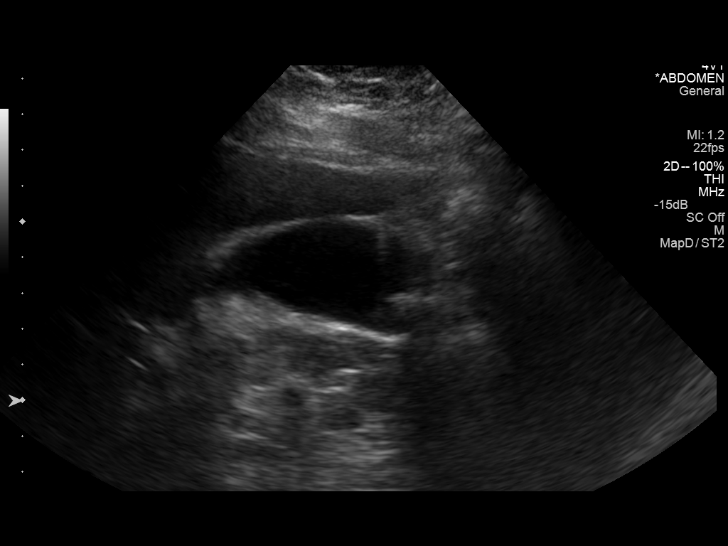
[im 87/105]
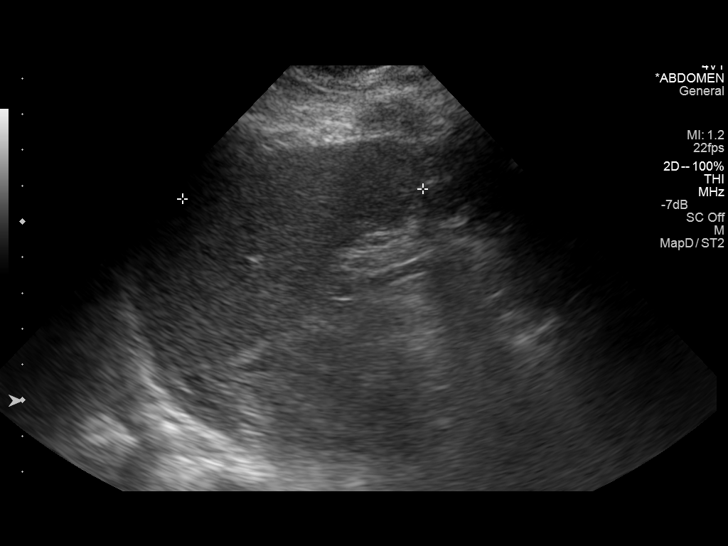
[im 96/105]
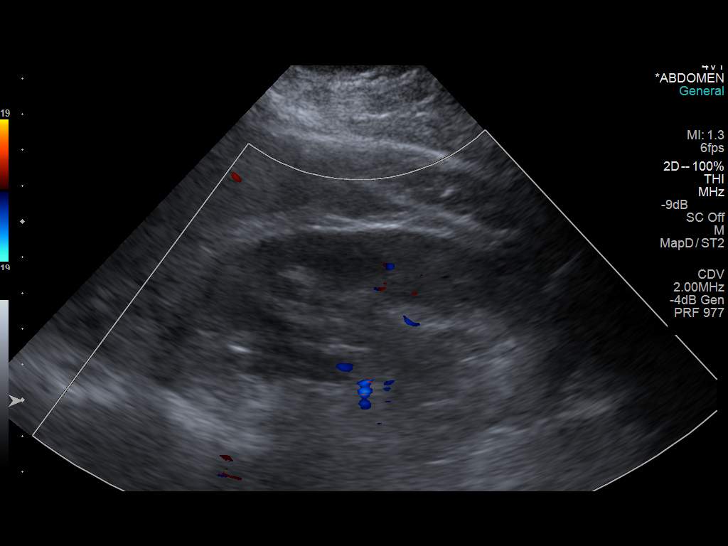
[im 105/105]
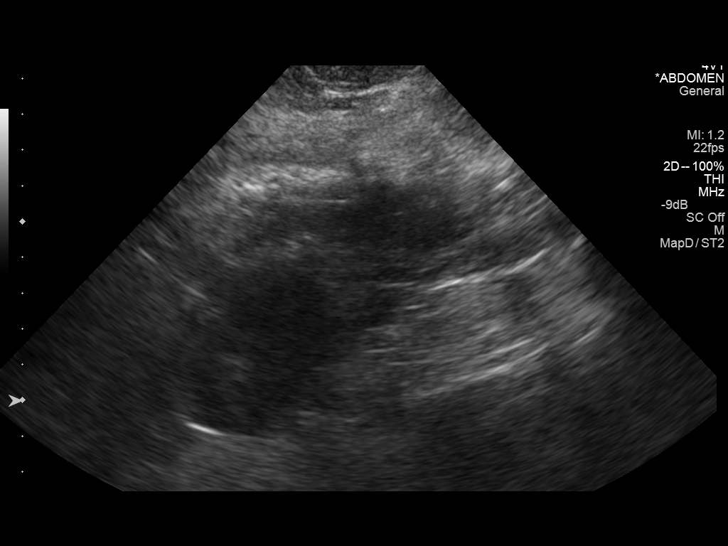

[14 of 25 positions shown; findings below may reference images not displayed]

FINDINGS: Gallbladder: Cholelithiasis without pericholecystic fluid or
gallbladder wall thickening. Ring down artifact from the gallbladder
wall as can be seen with adenomyomatosis. Positive sonographic
Murphy sign.

Common bile duct: Diameter: 3.6 mm

Liver: No focal lesion identified. Increased hepatic parenchymal
echogenicity.

IVC: No abnormality visualized.

Pancreas: Visualized portion unremarkable.

Spleen: Size and appearance within normal limits.

Right Kidney: Length: 10.6 cm. Echogenicity within normal limits. No
mass or hydronephrosis visualized.

Left Kidney: Length: 11.7 cm. Echogenicity within normal limits. No
mass or hydronephrosis visualized.

Abdominal aorta: No aneurysm visualized.

Other findings: None.
IMPRESSION: 1. Cholelithiasis without sonographic evidence of acute
cholecystitis.
2. Increased hepatic parenchymal echogenicity as can be seen with
hepatic steatosis.

## 2020-06-01 ENCOUNTER — Encounter (HOSPITAL_COMMUNITY): Payer: Self-pay | Admitting: Emergency Medicine

## 2020-06-01 ENCOUNTER — Ambulatory Visit (HOSPITAL_COMMUNITY)
Admission: EM | Admit: 2020-06-01 | Discharge: 2020-06-01 | Disposition: A | Discharge status: Home / Self Care | Payer: 59 | Attending: Emergency Medicine | Admitting: Emergency Medicine

## 2020-06-01 ENCOUNTER — Other Ambulatory Visit: Payer: Self-pay

## 2020-06-01 DIAGNOSIS — M25561 Pain in right knee: Secondary | ICD-10-CM

## 2020-06-01 LAB — D-DIMER, QUANTITATIVE: D-Dimer, Quant: 0.43 ug/mL-FEU (ref 0.00–0.50)

## 2020-06-01 NOTE — Discharge Instructions (Addendum)
I will contact you only if your D-dimer is positive.  If you have not heard from me by the end of the evening, you can assume that your D-dimer is negative.  If it is positive, you will need to go to the emergency department to rule out blood clot in your leg.  If it is negative, then we have effectively ruled out blood clot in your leg/DVT.  take 1-2 OTC Aleve twice a day with 1000 g of Tylenol.  Ice your knee.  I have placed a referral to [facility] if not better in a week  Below is a list of primary care practices who are taking new patients for you to follow-up with.  [facility] internal medicine clinic Ground Floor - [facility], [place], [place], [place] [address] [phone]  [facility] Primary [facility] at [facility] [address] [place], [place] [address] [phone]  [place] [facility] [place], [place] [address] [phone]  [facility] [phone] [place] [address]  [facility]: [place]  [phone]  [place] [facility]: [place]   [phone]  [facility]: [address] [place]  [phone]  [facility] : [address] [place] [phone]  [facility]: [address] [place] [id number] [phone]  [facility]: [place] [phone]  Dr. Blanchie Serve [place] [address]  [phone]  Go to www.goodrx.com to look up your medications. This will give you a list of where you can find your prescriptions at the most affordable prices. Or ask the  pharmacist what the cash price is, or if they have any other discount programs available to help make your medication more affordable. This can be less expensive than what you would pay with insurance.

## 2020-06-01 NOTE — ED Provider Notes (Signed)
HPI  SUBJECTIVE:  Ann Hill is a 65 y.o. female who presents with constant, throbbing posterior and lateral right knee pain for the past week that is getting worse.  She reports calf pain and states that the pain now goes up the side and back of her leg.  She denies back pain, rash, trauma, change in physical activity, knee joint swelling, popliteal fossa swelling, erythema, increased temperature, fevers, body aches.  She has never had symptoms like this before.  No surgery in the past 4 weeks.  No recent immobilization, no chest pain, shortness of breath, hemoptysis.  She has tried Aleve OTC 1 tab daily with improvement in her symptoms.  Symptoms are worse with extension of the knee, palpation, especially along the lateral thigh.  Past medical history negative for diabetes, hypertension, hypercoagulability, DVT, PE, cancer, right knee injury.  PMD: None.   Past Medical History:  Diagnosis Date  . Calculus of gallbladder with acute cholecystitis 03/16/2014  . Dizziness and giddiness   . Family history of adverse reaction to anesthesia    Sister would get sick  . Rash of entire body since October 2015 03/16/2014    Past Surgical History:  Procedure Laterality Date  . BREAST LUMPECTOMY     Bilateral  . BUNIONECTOMY    . CHOLECYSTECTOMY N/A 03/16/2014   Procedure: SINGLE SITE LAPAROSCOPIC CHOLECYSTECTOMY WITH INTRAOPERATIVE CHOLANGIOGRAM;  Surgeon: Michael Boston, MD;  Location: [facility];  Service: General;  Laterality: N/A;  . ENDOSCOPIC RETROGRADE CHOLANGIOPANCREATOGRAPHY (ERCP) WITH PROPOFOL N/A 03/18/2014   Procedure: ENDOSCOPIC RETROGRADE CHOLANGIOPANCREATOGRAPHY (ERCP) WITH PROPOFOL;  Surgeon: Missy Sabins, MD;  Location: [facility];  Service: Endoscopy;  Laterality: N/A;  . LAPAROSCOPIC CHOLECYSTECTOMY SINGLE SITE WITH INTRAOPERATIVE CHOLANGIOGRAM  03/16/2014    Family History  Problem Relation Age of Onset  . Hypertension Mother   . COPD Mother   . Cataracts Father   . Cancer Sister         brain(tumor)  . Heart disease Brother   . Lymphoma Brother     Social History   Tobacco Use  . Smoking status: Never Smoker  . Smokeless tobacco: Never Used    No current facility-administered medications for this encounter.  Current Outpatient Medications:  .  loratadine (CLARITIN) 10 MG tablet, Take 10 mg by mouth daily., Disp: , Rfl:   Allergies  Allergen Reactions  . Morphine And Related     itching     ROS  As noted in HPI.   Physical Exam  BP (!) 154/75   Pulse 79   Temp 98.5 F (36.9 C) (Oral)   Resp 16   SpO2 98%   Constitutional: Well developed, well nourished, no acute distress Eyes:  EOMI, conjunctiva normal bilaterally HENT: Normocephalic, atraumatic,mucus membranes moist Respiratory: Normal inspiratory effort Cardiovascular: Normal rate GI: nondistended skin: No rash, skin intact Musculoskeletal: no deformities Right knee:.  Popliteal fossa symmetric bilaterally.  Calves symmetric bilaterally.  Positive tenderness in the popliteal fossa and in between the gastrocnemius.  No palpable cord.  No tenderness along the greater saphenous vein.  Positive tenderness at the insertion of the IT band laterally.  Calves symmetric.  DP 2+.  Positive mild crepitus with flexion-extension.  ROM baseline for Pt, Flexion  intact,  Patella NT, Patellar tendon NT, Medial joint mildly tender, Lateral joint NT, Varus MCL stress testing stable, Valgus LCL stress testing stable, McMurray's testing normal, Lachman's negative. Distal NVI with intact baseline sensation / motor / pulse distal to knee.  No effusion. No erythema. No increased temperature.  Neurologic: Alert & oriented x 3, no focal neuro deficits Psychiatric: Speech and behavior appropriate   ED Course   Medications - No data to display  Orders Placed This Encounter  Procedures  . D-dimer, quantitative    Standing Status:   Standing    Number of Occurrences:   1  . AMB referral to sports medicine     Referral Priority:   Routine    Referral Type:   Consultation    Number of Visits Requested:   1    Results for orders placed or performed during the hospital encounter of 06/01/20 (from the past 24 hour(s))  D-dimer, quantitative     Status: None   Collection Time: 06/01/20  3:13 PM  Result Value Ref Range   D-Dimer, Quant 0.43 0.00 - 0.50 ug/mL-FEU   No results found.  ED Clinical Impression  1. Acute pain of right knee      ED Assessment/Plan  Patient with posterior right knee pain and tenderness at the IT band.  Most likely musculoskeletal, but she does have tenderness in the popliteal fossa.  Wells score 1.  Will get D-dimer to rule out DVT.  If positive, will contact patient at [phone] and have her go to the ED for ultrasound and anticoagulation if necessary.  If negative, we have effectively ruled out DVT.  Will have patient take 1-2 OTC Aleve twice a day with 1000 g of Tylenol.  Will refer to [facility] sports medicine if not better in a week.  Will provide primary care list for ongoing care and order assistance in following a PMD.  D-dimer negative.  Will treat as musculoskeletal pain. mychart note sent to patient.,   Discussed labs, MDM, treatment plan, and plan for follow-up with patient. Discussed sn/sx that should prompt return to the ED. patient agrees with plan.   No orders of the defined types were placed in this encounter.     *This clinic note was created using Dragon dictation software. Therefore, there may be occasional mistakes despite careful proofreading.  ?    Melynda Ripple, MD 06/02/20 1200

## 2020-06-01 NOTE — ED Triage Notes (Signed)
Right knee pain for 1 week, no injury. Worsened over last week and now has pain in upper right leg.

## 2020-06-04 ENCOUNTER — Telehealth (HOSPITAL_BASED_OUTPATIENT_CLINIC_OR_DEPARTMENT_OTHER): Payer: Self-pay

## 2020-06-04 NOTE — Telephone Encounter (Signed)
-----   Message from Curt Jews, RN sent at 06/04/2020  3:31 PM EDT ----- Regarding: UC to PCP Patient needs to establish with PCP - routine

## 2020-06-11 ENCOUNTER — Ambulatory Visit: Payer: 59 | Admitting: Family Medicine

## 2020-07-16 ENCOUNTER — Other Ambulatory Visit: Payer: Self-pay

## 2020-07-16 ENCOUNTER — Other Ambulatory Visit (HOSPITAL_BASED_OUTPATIENT_CLINIC_OR_DEPARTMENT_OTHER): Payer: Self-pay

## 2020-07-16 ENCOUNTER — Ambulatory Visit: Payer: 59 | Attending: Internal Medicine

## 2020-07-16 DIAGNOSIS — Z23 Encounter for immunization: Secondary | ICD-10-CM

## 2020-07-16 MED ORDER — COVID-19 MRNA VACC (MODERNA) 100 MCG/0.5ML IM SUSP
INTRAMUSCULAR | 0 refills | Status: DC
  Filled 2020-07-16: qty 0.25, 1d supply, fill #0

## 2020-07-16 NOTE — Progress Notes (Signed)
   Covid-19 Vaccination Clinic  Name:  CASEE KNEPP    MRN: [id number] DOB: 1956-01-17  07/16/2020  Ms. Caldwell was observed post Covid-19 immunization for 15 minutes without incident. She was provided with Vaccine Information Sheet and instruction to access the V-Safe system.   Ms. Yousuf was instructed to call 911 with any severe reactions post vaccine: Marland Kitchen Difficulty breathing  . Swelling of face and throat  . A fast heartbeat  . A bad rash all over body  . Dizziness and weakness   Immunizations Administered    Name Date Dose VIS Date Route   Moderna Covid-19 Booster Vaccine 07/16/2020 11:38 AM 0.25 mL 12/05/2019 Intramuscular   Manufacturer: Moderna   Lot: [id number]   [place]: [id number]

## 2020-08-11 ENCOUNTER — Ambulatory Visit: Payer: 59 | Admitting: Family Medicine

## 2020-11-14 ENCOUNTER — Ambulatory Visit: Payer: 59

## 2020-11-19 ENCOUNTER — Encounter: Payer: Self-pay | Admitting: Neurology

## 2020-11-20 ENCOUNTER — Ambulatory Visit: Payer: 59 | Admitting: Neurology

## 2020-11-20 ENCOUNTER — Encounter: Payer: Self-pay | Admitting: Neurology

## 2020-11-20 VITALS — BP 134/75 | HR 70 | Ht 67.0 in | Wt 196.5 lb

## 2020-11-20 DIAGNOSIS — D45 Polycythemia vera: Secondary | ICD-10-CM | POA: Diagnosis not present

## 2020-11-20 DIAGNOSIS — R0683 Snoring: Secondary | ICD-10-CM

## 2020-11-20 DIAGNOSIS — R42 Dizziness and giddiness: Secondary | ICD-10-CM | POA: Insufficient documentation

## 2020-11-20 DIAGNOSIS — H5509 Other forms of nystagmus: Secondary | ICD-10-CM

## 2020-11-20 DIAGNOSIS — R2681 Unsteadiness on feet: Secondary | ICD-10-CM

## 2020-11-20 NOTE — Patient Instructions (Signed)
Dizziness Dizziness is a common problem. It is a feeling of unsteadiness or light-headedness. You may feel like you are about to faint. Dizziness can lead to injury if you stumble or fall. Anyone can become dizzy, but dizziness is more common in older adults. This condition can be caused by a number of things, including medicines, dehydration, or illness. Follow these instructions at home: Eating and drinking  Drink enough fluid to keep your urine pale yellow. This helps to keep you from becoming dehydrated. Try to drink more Elie fluids, such as water. Do not drink alcohol. Limit your caffeine intake if told to do so by your health care provider. Check ingredients and nutrition facts to see if a food or beverage contains caffeine. Limit your salt (sodium) intake if told to do so by your health care provider. Check ingredients and nutrition facts to see if a food or beverage contains sodium. Activity  Avoid making quick movements. Rise slowly from chairs and steady yourself until you feel okay. In the morning, first sit up on the side of the bed. When you feel okay, stand slowly while you hold onto something until you know that your balance is good. If you need to stand in one place for a long time, move your legs often. Tighten and relax the muscles in your legs while you are standing. Do not drive or use machinery if you feel dizzy. Avoid bending down if you feel dizzy. Place items in your home so that they are easy for you to reach without leaning over. Lifestyle Do not use any products that contain nicotine or tobacco. These products include cigarettes, chewing tobacco, and vaping devices, such as e-cigarettes. If you need help quitting, ask your health care provider. Try to reduce your stress level by using methods such as yoga or meditation. Talk with your health care provider if you need help to manage your stress. General instructions Watch your dizziness for any changes. Take  over-the-counter and prescription medicines only as told by your health care provider. Talk with your health care provider if you think that your dizziness is caused by a medicine that you are taking. Tell a friend or a family member that you are feeling dizzy. If he or she notices any changes in your behavior, have this person call your health care provider. Keep all follow-up visits. This is important. Contact a health care provider if: Your dizziness does not go away or you have new symptoms. Your dizziness or light-headedness gets worse. You feel nauseous. You have reduced hearing. You have a fever. You have neck pain or a stiff neck. Your dizziness leads to an injury or a fall. Get help right away if: You vomit or have diarrhea and are unable to eat or drink anything. You have problems talking, walking, swallowing, or using your arms, hands, or legs. You feel generally weak. You have any bleeding. You are not thinking clearly or you have trouble forming sentences. It may take a friend or family member to notice this. You have chest pain, abdominal pain, shortness of breath, or sweating. Your vision changes or you develop a severe headache. These symptoms may represent a serious problem that is an emergency. Do not wait to see if the symptoms will go away. Get medical help right away. Call your local emergency services (911 in the [place] Do not drive yourself to the hospital. Summary Dizziness is a feeling of unsteadiness or light-headedness. This condition can be caused by a number of   things, including medicines, dehydration, or illness. Anyone can become dizzy, but dizziness is more common in older adults. Drink enough fluid to keep your urine pale yellow. Do not drink alcohol. Avoid making quick movements if you feel dizzy. Monitor your dizziness for any changes. This information is not intended to replace advice given to you by your health care provider. Make sure you discuss any  questions you have with your health care provider. Document Revised: 01/07/2020 Document Reviewed: 01/07/2020 Elsevier Patient Education  2022 Elsevier Inc.  

## 2020-11-20 NOTE — Progress Notes (Signed)
[facility]  Provider:  Dr Brett Fairy Referring Provider: Ann Maki, DO Primary Care Physician:  Ann Maki, DO  Chief Complaint  Patient presents with   New Patient (Initial Visit)    Rm 10, alone. Paper referral for progressive intermittent instability. Instability gradually getting worse over the year, started around 2015. No falls, usually occurs when outside. Recelty had her vision checked, has corrected herr strength to match in both eyes. Doesn't feel dizzy.     HPI:  Ann Hill is a 65 y.o. female here as a referral from Dr. Florene Hill for gait instability.  Mrs. Ann Hill reported that her first awareness of gait instability occurred about 7 years ago at the end of a long walk she felt unstable, she did not fall, but she had a sensation that she was off balance.  This instability has gradually progressed soon after she noted difficulties to step on an escalator the moving surface was making her uncomfortable.  After that she noted an instability or fear when walking over shiny floors, and then when descending a long set of stairs.  Open spaces have now also given her an uneasy feeling and she is better when she has a rail to hold onto or another person to support her.  Until now she has never fallen she has not passed out she had her vision checked and she does deny any kind of dizziness but just being seated or in daily occurrences.  Her contacts have been changed and she felt better , more balanced.  No instability at home on stairs, holding on to rail. No trouble walking in the dark.  She has had a spontaneous fracture of the right femur- April 2022! Has an effusion in the knee.  *-05-2020 Elevated Hematocrit and hemoglobin noted on last CBC, diff, metabolic panel with Dr. Florene Hill.     Review of Systems: Out of a complete 14 system review, the patient complains of only the following symptoms, and all other reviewed systems are negative.  Gait instability.    Social History   Socioeconomic History   Marital status: Married    Spouse name: Ann Hill   Number of children: 2   Years of education: Not on file   Highest education level: Bachelor's degree (e.g., BA, AB, BS)  Occupational History   Not on file  Tobacco Use   Smoking status: Never   Smokeless tobacco: Never  Substance and Sexual Activity   Alcohol use: Not on file   Drug use: Not on file   Sexual activity: Not on file  Other Topics Concern   Not on file  Social History Narrative   Lives with husband and son   Right handed   Caffeine: herbal tea 1x morning   Social Determinants of Health   Financial Resource Strain: Not on file  Food Insecurity: Not on file  Transportation Needs: Not on file  Physical Activity: Not on file  Stress: Not on file  Social Connections: Not on file  Intimate Partner Violence: Not on file    Family History  Problem Relation Age of Onset   Hypertension Mother    COPD Mother    Cataracts Father    Cancer Sister        brain(tumor)   Heart disease Brother    Lymphoma Brother     Past Medical History:  Diagnosis Date   Calculus of gallbladder with acute cholecystitis 03/16/2014   Dizziness and giddiness    Family history of adverse reaction  to anesthesia    Sister would get sick   Rash of entire body since October 2015 03/16/2014    Past Surgical History:  Procedure Laterality Date   BREAST LUMPECTOMY     Bilateral   BUNIONECTOMY     CHOLECYSTECTOMY N/A 03/16/2014   Procedure: SINGLE SITE LAPAROSCOPIC CHOLECYSTECTOMY WITH INTRAOPERATIVE CHOLANGIOGRAM;  Surgeon: Michael Boston, MD;  Location: [facility];  Service: General;  Laterality: N/A;   ENDOSCOPIC RETROGRADE CHOLANGIOPANCREATOGRAPHY (ERCP) WITH PROPOFOL N/A 03/18/2014   Procedure: ENDOSCOPIC RETROGRADE CHOLANGIOPANCREATOGRAPHY (ERCP) WITH PROPOFOL;  Surgeon: Missy Sabins, MD;  Location: [facility];  Service: Endoscopy;  Laterality: N/A;   LAPAROSCOPIC CHOLECYSTECTOMY SINGLE SITE  WITH INTRAOPERATIVE CHOLANGIOGRAM  03/16/2014    Current Outpatient Medications  Medication Sig Dispense Refill   CALCIUM PO Take 1 tablet by mouth 3 (three) times daily.     No current facility-administered medications for this visit.    Allergies as of 11/20/2020 - Review Complete 11/20/2020  Allergen Reaction Noted   Morphine Itching 11/10/2015   Morphine and related  07/31/2013    Vitals: BP 134/75   Pulse 70   Ht 5\' 7"  (1.702 m)   Wt 196 lb 8 oz (89.1 kg)   BMI 30.78 kg/m  Last Weight:  Wt Readings from Last 1 Encounters:  11/20/20 196 lb 8 oz (89.1 kg)   Last Height:   Ht Readings from Last 1 Encounters:  11/20/20 5\' 7"  (1.702 m)   Vision Screening:   Physical exam:  General: The patient is awake, alert and appears not in acute distress. The patient is well groomed. Head: Normocephalic, atraumatic. Neck is supple.  Cardiovascular:  Regular rate and rhythm, without  murmurs or carotid bruit, and without distended neck veins. Respiratory: Lungs are Reiling to auscultation. Skin:  Without evidence of edema, or rash Trunk: BMI is 30.78 elevated and patient  has normal posture.   Neurologic exam : The patient is awake and alert, oriented to place and time.  Memory subjective  described as intact. There is a normal attention span & concentration ability. Speech is fluent without  dysarthria, dysphonia or aphasia. Mood and affect are appropriate.  Cranial nerves: Pupils are equal and briskly reactive to light. Funduscopic exam without  evidence of pallor or edema. Extraocular movements  in vertical and horizontal planes intact and with nystagmus on gaze to the left . Visual fields by finger perimetry are intact. Hearing to finger rub intact.  Facial sensation intact to fine touch. Facial motor strength is symmetric and tongue and uvula move midline.  Motor exam:   Normal tone and normal muscle bulk and symmetric normal strength in all extremities.  Sensory:  Fine touch  and vibration were tested in all extremities. Proprioception is tested in the upper extremities only. This was  normal.  Coordination: Rapid alternating movements in the fingers/hands is tested and normal. Finger-to-nose maneuver tested and normal without evidence of ataxia, dysmetria or tremor.  Gait and station: Patient walks without assistive device and is able and assisted stool climb up to the exam table. Strength within normal limits. Stance is stable and normal. Tandem gait is intact, turns with 3 Steps -unfragmented. Romberg testing is normal.  Deep tendon reflexes: in the  upper and lower extremities are symmetric and intact. Babinski maneuver response is  downgoing.   Assessment:  After physical and neurologic examination, review of laboratory studies, imaging, neurophysiology testing and pre-existing records, total time of 60 minutesassessment :  Mrs. Longino has according to  her husband never been very comfortable in climbing to [place] without a guardrail there is some possible agoraphobia, but the instabilities that she has noticed may have arisen step-by-step and slowly over the last decade.  She has also mentioned how unstable she felt when she walked on the beach in the water sensing that the sand was drawn away from under her feet.  I checked her for neuropathy and really there is a normal sensory threshold for vibration checked by tuning fork.  Her MRI movements show an endpoint nystagmus to the left side, this may indicate an underlying subclinical vertigo condition. We also discussed if there may be an anxiety component to this as she has never tried again to go on an escalator.  The thought is highly upsetting to her. She has no problem driving or flying.  Patient will be referred for vestibular rehab I would like my colleagues there to look at her instability by using the step on some eyeball feature.  I also like for her husband to take a video of her walking on  different surfaces.  And we will meet again in 2 months to discuss if an antianxiety treatment may be another helpful approach. MRI brain ordered.

## 2020-11-25 ENCOUNTER — Telehealth: Payer: Self-pay | Admitting: Neurology

## 2020-11-25 NOTE — Telephone Encounter (Signed)
MR Brain w/wo contrast Dr. Brett Fairy [employer] Josem Kaufmann: [place] via [facility] website. Patient is scheduled at [facility] for 11/26/20.

## 2020-11-26 ENCOUNTER — Ambulatory Visit: Payer: 59

## 2020-11-26 DIAGNOSIS — H5509 Other forms of nystagmus: Secondary | ICD-10-CM

## 2020-11-26 DIAGNOSIS — R2681 Unsteadiness on feet: Secondary | ICD-10-CM | POA: Diagnosis not present

## 2020-11-26 MED ORDER — GADOBENATE DIMEGLUMINE 529 MG/ML IV SOLN
15.0000 mL | Freq: Once | INTRAVENOUS | Status: AC | PRN
  Administered 2020-11-26: 15 mL via INTRAVENOUS

## 2020-11-28 ENCOUNTER — Encounter: Payer: Self-pay | Admitting: Neurology

## 2020-11-28 NOTE — Progress Notes (Signed)
Normal MRI of the brain, no scar tissue, tumor, stroke,etc: FINDINGS: On sagittal images, the spinal cord is imaged caudally to C3 and is normal in caliber.   The contents of the posterior fossa are of normal size and position.   The pituitary gland and optic chiasm appear normal.    Brain volume appears normal for age.   The ventricles are normal in size and without distortion.  There are no abnormal extra-axial collections of fluid.    The cerebellum and brainstem appears normal.   The deep gray matter appears normal.  The cerebral hemispheres appear normal for age.  Diffusion weighted images are normal.  Gradient echo heme weighted images are normal.    The orbits appear normal.   The VIIth/VIIIth nerve complex appears normal.  The mastoid air cells appear normal.  The paranasal sinuses appear normal.  Flow voids are identified within the major intracerebral arteries.     After the infusion of contrast material, a normal enhancement pattern is noted.   IMPRESSION: This is a normal MRI of the brain with and without contrast.

## 2020-12-02 ENCOUNTER — Ambulatory Visit: Payer: 59 | Attending: Family Medicine | Admitting: Physical Therapy

## 2020-12-02 ENCOUNTER — Encounter: Payer: Self-pay | Admitting: Physical Therapy

## 2020-12-02 ENCOUNTER — Other Ambulatory Visit: Payer: Self-pay

## 2020-12-02 DIAGNOSIS — R2681 Unsteadiness on feet: Secondary | ICD-10-CM | POA: Diagnosis present

## 2020-12-02 DIAGNOSIS — H8111 Benign paroxysmal vertigo, right ear: Secondary | ICD-10-CM | POA: Diagnosis not present

## 2020-12-02 DIAGNOSIS — R42 Dizziness and giddiness: Secondary | ICD-10-CM

## 2020-12-02 DIAGNOSIS — R262 Difficulty in walking, not elsewhere classified: Secondary | ICD-10-CM

## 2020-12-02 DIAGNOSIS — H5509 Other forms of nystagmus: Secondary | ICD-10-CM | POA: Insufficient documentation

## 2020-12-03 NOTE — Therapy (Signed)
[facility] [address] [place], [place], [address] Phone: [phone]   Fax:  [phone]  Physical Therapy Evaluation  Patient Details  Name: Ann Hill MRN: [id number] Date of Birth: 24-Dec-1955 Referring Provider (PT): Dohmeier, Asencion Partridge, MD   Encounter Date: 12/02/2020   PT End of Session - 12/03/20 1417     Visit Number 1    Number of Visits 17    Date for PT Re-Evaluation 02/01/21    Authorization Type [employer] 2022    PT Start Time 0807    PT Stop Time 0855    PT Time Calculation (min) 48 min    Activity Tolerance Patient tolerated treatment well    Behavior During Therapy [facility] for tasks assessed/performed             Past Medical History:  Diagnosis Date   Calculus of gallbladder with acute cholecystitis 03/16/2014   Dizziness and giddiness    Family history of adverse reaction to anesthesia    Sister would get sick   Rash of entire body since October 2015 03/16/2014    Past Surgical History:  Procedure Laterality Date   BREAST LUMPECTOMY     Bilateral   BUNIONECTOMY     CHOLECYSTECTOMY N/A 03/16/2014   Procedure: SINGLE SITE LAPAROSCOPIC CHOLECYSTECTOMY WITH INTRAOPERATIVE CHOLANGIOGRAM;  Surgeon: Michael Boston, MD;  Location: [facility];  Service: General;  Laterality: N/A;   ENDOSCOPIC RETROGRADE CHOLANGIOPANCREATOGRAPHY (ERCP) WITH PROPOFOL N/A 03/18/2014   Procedure: ENDOSCOPIC RETROGRADE CHOLANGIOPANCREATOGRAPHY (ERCP) WITH PROPOFOL;  Surgeon: Missy Sabins, MD;  Location: [facility];  Service: Endoscopy;  Laterality: N/A;   LAPAROSCOPIC CHOLECYSTECTOMY SINGLE SITE WITH INTRAOPERATIVE CHOLANGIOGRAM  03/16/2014    There were no vitals filed for this visit.    Subjective Assessment - 12/02/20 0809     Subjective Began years ago, when she would go for long walks by the end of the walk she would feel unsteady.  Then was unable to go on escalators, stairs or hills without feeling unstable.  Open areas  cause her to feel unstable and standing for a long period of time make her feel unstable.  No issues with driving.  Denies true vertigo; just feels unstable.  Didn't realize how bad it was because during [place] she was not out of the house very often.  Gets panicked if she can't park close to a building.  No falls.  Denies any respiratory use or heavy antibiotic use.  Denies any changes in hearing or tinnitus.  Denies nausea and vomiting.  Denies HA or neck pain.  Denies changes in sensation.  Wears monovision contact lenses - which has been corrected, no other issues with vision noted.  Hasn't felt normal in 15 years until one time when she had a few alcoholic drinks and she felt very stable!  Was reading that alcohol increased serotonin and is wondering if her serotonin is low.    Pertinent History No significant PMH    Limitations Standing;Walking    Diagnostic tests MRI - no acute abnormalities    Patient Stated Goals To be more stable and be able to go for a walk; to not have anxiety about walking around in the community    Currently in Pain? 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  Had ear infections as a child.    Type of Dizziness  Imbalance    Frequency of Dizziness Daily    Duration of Dizziness Brief    Symptom Nature Intermittent    Aggravating Factors Activity in general;Comment   escalators, hills, open spaces, stairs without rails   Relieving Factors Comments   sitting, drinking 1-2 alcoholic drinks   Progression of Symptoms Worse    History of similar episodes Has been ongoing >10 years      Oculomotor Exam   Oculomotor Alignment Normal    Ocular ROM WFL    Spontaneous Absent    Gaze-induced  Absent    Smooth Pursuits Intact    Saccades Intact      Oculomotor Exam-Fixation Suppressed    Left Head Impulse Positive    Right Head Impulse Positive      Vestibulo-Ocular Reflex   VOR to Slow Head Movement Normal    VOR Cancellation Normal      Visual Acuity   Static 7    Dynamic 4      Positional Testing   Dix-Hallpike Dix-Hallpike Right;Dix-Hallpike Left    Horizontal Canal Testing Horizontal Canal Right;Horizontal Canal Left      Dix-Hallpike Right   Dix-Hallpike Right Duration 60   No sense of vertigo   Dix-Hallpike Right Symptoms Upbeat, right rotatory nystagmus   low amplitude     Dix-Hallpike Left   Dix-Hallpike Left Duration 0    Dix-Hallpike Left Symptoms No nystagmus      Horizontal Canal Right   Horizontal Canal Right Duration 0    Horizontal Canal Right Symptoms Normal      Horizontal Canal Left   Horizontal Canal Left Duration 0    Horizontal Canal Left Symptoms Normal      Positional Sensitivities   Sit to Supine No dizziness    Supine to Left Side No dizziness     Supine to Right Side No dizziness    Supine to Sitting No dizziness    Right Hallpike No dizziness    Up from Right Hallpike No  dizziness    Up from Left Hallpike No dizziness    Rolling Right No dizziness    Rolling Left No dizziness                Objective measurements completed on examination: See above findings.                PT Education - 12/03/20 1416     Education Details Clinical findings, PT POC and goals    Person(s) Educated Patient    Methods Explanation    Comprehension Verbalized understanding              PT Short Term Goals - 12/03/20 1431       PT SHORT TERM GOAL #1   Title Pt will participate in further assessment of vestibular function with MSQ and SOT    Time 4    Period Weeks    Status New    Target Date 01/02/21      PT SHORT TERM GOAL #2   Title Pt will participate in assessment of falls risk during gait with FGA    Time 4    Period Weeks    Status New    Target Date 01/02/21      PT SHORT TERM GOAL #3   Title Pt will demonstrate resolution of R posterior canal cupulolithiasis    Time 4    Period Weeks    Status New    Target Date 01/02/21      PT SHORT TERM GOAL #4   Title Pt will demonstrate independence with initial HEP    Time 4    Period Weeks    Status New    Target Date 01/02/21               PT Long Term Goals - 12/03/20 1437       PT LONG TERM GOAL #1   Title Pt will demonstrate independence with final balance and vestibular HEP    Time 8    Period Weeks    Status New    Target Date 02/01/21      PT LONG TERM GOAL #2   Title Pt will demonstrate improved VOR as indicated by 2 line difference on DVA    Baseline 3 lines; 7,4    Time 8    Period Weeks    Status New    Target Date 02/01/21      PT LONG TERM GOAL #3   Title Pt will demonstrate 4 point improvement on FGA to indicate decreased falls risk    Time 8    Period Weeks    Status New    Target Date 02/01/21      PT LONG  TERM GOAL #4   Title Pt will demonstrate 8 point improvement on SOT    Time 8    Period Weeks    Status New    Target Date 02/01/21      PT LONG TERM GOAL #5   Title Pt will be able to negotiate 8 stairs and ambulate >1,000 outside over various terrain independently without sensation of instability.    Time 8    Period Weeks    Status New    Target Date 02/01/21                    Plan - 12/03/20 1423     Clinical Impression Statement Pt is a 65 year old female referred to Neuro OPPT for  evaluation of gait instability that is chronic in nature and has been progressing over the past 10 years.  Pt's PMH is significant for the following: no significant PMH except h/o ear infections and motion sickness as a child. The following deficits were noted during pt's exam: positive for refixation saccade with head impulse test and 3 line difference between SVA and DVA indicating vestibular hypofunction, disequilibrium, visual motion sensitivity, impaired standing balance and R torsional, upbeating nystagmus lasting >60 seconds which may indicate R posterior canal cupulolithiasis.  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 Pt would benefit from skilled PT to address these impairments and functional limitations to maximize functional mobility independence and reduce falls risk.    Personal Factors and Comorbidities Fitness;Past/Current Experience;Time since onset of injury/illness/exacerbation    Examination-Activity Limitations Locomotion Level;Stairs;Stand    Examination-Participation Restrictions Community Activity;Shop    Stability/Clinical Decision Making Stable/Uncomplicated    Clinical Decision Making Low    Rehab Potential Good    PT Frequency 2x / week    PT  Duration 8 weeks    PT Treatment/Interventions ADLs/Self Care Home Management;Canalith Repostioning;Gait training;Stair training;Functional mobility training;Therapeutic activities;Therapeutic exercise;DME Instruction;Neuromuscular re-education;Balance training;Patient/family education;Vestibular    PT Next Visit Plan Perform FGA, MSQ, SOT.  Treat R cupulolithiasis.  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 [place], [place], [address] Phone: [phone]   Fax:  [phone]  Name: Ann Hill MRN: [id number] Date of Birth: January 12, 1956

## 2020-12-03 NOTE — Patient Instructions (Signed)
[email or web address]

## 2020-12-09 ENCOUNTER — Encounter: Payer: Self-pay | Admitting: Physical Therapy

## 2020-12-15 ENCOUNTER — Ambulatory Visit (INDEPENDENT_AMBULATORY_CARE_PROVIDER_SITE_OTHER): Payer: 59 | Admitting: Neurology

## 2020-12-15 DIAGNOSIS — H5509 Other forms of nystagmus: Secondary | ICD-10-CM

## 2020-12-15 DIAGNOSIS — G4733 Obstructive sleep apnea (adult) (pediatric): Secondary | ICD-10-CM

## 2020-12-15 DIAGNOSIS — D45 Polycythemia vera: Secondary | ICD-10-CM

## 2020-12-15 DIAGNOSIS — R0683 Snoring: Secondary | ICD-10-CM

## 2020-12-15 DIAGNOSIS — R2681 Unsteadiness on feet: Secondary | ICD-10-CM

## 2020-12-18 NOTE — Progress Notes (Signed)
[facility] TEST REPORT ( by Watch PAT)   STUDY DATE:  12-22-2020 DOB:  [birth year]    ORDERING CLINICIAN: Larey Seat, MD  REFERRING CLINICIAN:    CLINICAL INFORMATION/HISTORY:  Ann Hill is a 65 y.o. female here as a referral from Dr. Florene Glen for gait instability. She also was found to have elevated hemoglobin and hematocrit, and did not travel to high altitudes, has no history of lung or heart diease.    Ann Hill reported that her first awareness of gait instability occurred about 7 years ago at the end of a long walk she felt unstable, she did not fall, but she had a sensation that she was off balance.  This instability has gradually progressed soon after she noted difficulties to step on an escalator the moving surface was making her uncomfortable.  After that she noted an instability or fear when walking over shiny floors, and then when descending a long set of stairs.  Open spaces have now also given her an uneasy feeling and she is better when she has a rail to hold onto or another person to support her.  Until now, she has never fallen ,she has not passed out , she had her vision checked and she does deny any kind of dizziness but just being seated or in daily occurrences.  No instability at home on stairs, holding on to rail. No trouble walking in the dark.   She has had a spontaneous fracture of the right femur- April 2022! Has an effusion in the knee.  Elevated Hematocrit and hemoglobin noted on last CBC, diff, metabolic panel with Dr. Florene Glen.    Epworth sleepiness score: N/A  /24. BMI: 30.7 kg/m Neck Circumference: 16"  Sleep Summary: The total recording time for this home sleep test amounted to 9 hours 53 minutes of which 8 hours and 19 minutes were calculated sleep time.        Percent REM (%): 26%                                      Respiratory Indices:   Calculated pAHI (per hour):   The apnea hypopnea index per hour was 6.9,  during REM sleep the AHI was at 10.7/h and during non-REM sleep at 5.5/h.                        Positional AHI: In supine sleep the AHI was 15.3 /h and in nonsupine sleep 6.8/h- the patient mostly slept on her left side but not in supine position.    Snoring data reveal a so-called mean volume of 40 dB but only present for less than 5% of the total sleep time.  So the 40 dB can well be artifact.  It is not Scaff to me if the patient snores at all.                                              Oxygen Saturation Statistics:  O2 Saturation Range (%):       Varied between a nadir of 81% and a maximum of 97% with a mean oxygenation saturation of 93%.  O2 Saturation (minutes) <89%: 0 minutes          Pulse Rate Statistics:    Pulse Range:   Varied between 45 and 86 bpm with a mean heart rate of 56 bpm.              IMPRESSION:  This HST confirms the presence of very mild sleep apnea that seems to be only present during REM sleep and in supine sleep.  Avoiding supine sleep alone is likely to treat this apnea sufficiently.  There was no associated hypoxemia that could explain the higher hematocrit and hemoglobin levels the patient had been diagnosed with.   RECOMMENDATION: Positional sleep habits should be changed away from a supine sleep position to a lateral sleep habit.  We can sometimes use the tennis ball method and some patients will invest in a sleep balance device by [employer].   Both methods are meant to remind the patient not to find himself on the back inadvertently at night.    INTERPRETING PHYSICIAN: Larey Seat, MD , Board certified by the ABPN and [place] Director of [facility].

## 2020-12-19 ENCOUNTER — Ambulatory Visit: Payer: 59 | Attending: Family Medicine | Admitting: Physical Therapy

## 2020-12-19 ENCOUNTER — Other Ambulatory Visit: Payer: Self-pay

## 2020-12-19 DIAGNOSIS — R42 Dizziness and giddiness: Secondary | ICD-10-CM | POA: Diagnosis present

## 2020-12-19 DIAGNOSIS — R2681 Unsteadiness on feet: Secondary | ICD-10-CM | POA: Insufficient documentation

## 2020-12-19 DIAGNOSIS — R262 Difficulty in walking, not elsewhere classified: Secondary | ICD-10-CM | POA: Diagnosis present

## 2020-12-19 NOTE — Patient Instructions (Signed)
Gaze Stabilization: Standing Feet Apart    Feet shoulder width apart, one finger touching back of chair for support; keeping eyes on target on wall _3___ feet away, tilt head down 15-30 and move head side to side for __30__ seconds. Repeat while moving head up and down for __30__ seconds. Rest and repeat Do ___2_ sessions per day.

## 2020-12-19 NOTE — Therapy (Signed)
[facility] [address] [place], [place], [address] Phone: [phone]   Fax:  [phone]  Physical Therapy Treatment  Patient Details  Name: Ann Hill MRN: [id number] Date of Birth: 1955-08-16 Referring Provider (PT): Dohmeier, Asencion Partridge, MD   Encounter Date: 12/19/2020   PT End of Session - 12/19/20 2116     Visit Number 2    Number of Visits 17    Date for PT Re-Evaluation 02/01/21    Authorization Type UHC 2022    PT Start Time 1326    PT Stop Time 1410    PT Time Calculation (min) 44 min    Activity Tolerance Patient tolerated treatment well    Behavior During Therapy [facility] for tasks assessed/performed             Past Medical History:  Diagnosis Date   Calculus of gallbladder with acute cholecystitis 03/16/2014   Dizziness and giddiness    Family history of adverse reaction to anesthesia    Sister would get sick   Rash of entire body since October 2015 03/16/2014    Past Surgical History:  Procedure Laterality Date   BREAST LUMPECTOMY     Bilateral   BUNIONECTOMY     CHOLECYSTECTOMY N/A 03/16/2014   Procedure: SINGLE SITE LAPAROSCOPIC CHOLECYSTECTOMY WITH INTRAOPERATIVE CHOLANGIOGRAM;  Surgeon: Michael Boston, MD;  Location: [facility];  Service: General;  Laterality: N/A;   ENDOSCOPIC RETROGRADE CHOLANGIOPANCREATOGRAPHY (ERCP) WITH PROPOFOL N/A 03/18/2014   Procedure: ENDOSCOPIC RETROGRADE CHOLANGIOPANCREATOGRAPHY (ERCP) WITH PROPOFOL;  Surgeon: Missy Sabins, MD;  Location: [facility];  Service: Endoscopy;  Laterality: N/A;   LAPAROSCOPIC CHOLECYSTECTOMY SINGLE SITE WITH INTRAOPERATIVE CHOLANGIOGRAM  03/16/2014    There were no vitals filed for this visit.   Subjective Assessment - 12/19/20 1341     Subjective Pt had to cancel previous session due to being sick with COVID.  Recovered well.  Had ENT appointment but did not do in depth vestibular testing.  Is going to make a follow up appt with neurology.   Symptoms are the same.  Has noticed that when she closes her eyes in the shower she has to touch the wall for balance.    Pertinent History No significant PMH    Limitations Standing;Walking    Diagnostic tests MRI - no acute abnormalities    Patient Stated Goals To be more stable and be able to go for a walk; to not have anxiety about walking around in the community    Currently in Pain? No/denies                [facility] PT Assessment - 12/19/20 1348       Functional Gait  Assessment   Gait assessed  Yes    Gait Level Surface Walks 20 ft in less than 5.5 sec, no assistive devices, good speed, no evidence for imbalance, normal gait pattern, deviates no more than 6 in outside of the 12 in walkway width.    Change in Gait Speed Able to smoothly change walking speed without loss of balance or gait deviation. Deviate no more than 6 in outside of the 12 in walkway width.    Gait with Horizontal Head Turns Performs head turns smoothly with slight change in gait velocity (eg, minor disruption to smooth gait path), deviates 6-10 in outside 12 in walkway width, or uses an assistive device.    Gait with Vertical Head Turns Performs task with slight change in gait velocity (eg, minor disruption  to smooth gait path), deviates 6 - 10 in outside 12 in walkway width or uses assistive device    Gait and Pivot Turn Pivot turns safely in greater than 3 sec and stops with no loss of balance, or pivot turns safely within 3 sec and stops with mild imbalance, requires small steps to catch balance.    Step Over Obstacle Is able to step over one shoe box (4.5 in total height) but must slow down and adjust steps to Dapper box safely. May require verbal cueing.    Gait with Narrow Base of Support Ambulates less than 4 steps heel to toe or cannot perform without assistance.    Gait with Eyes Closed Cannot walk 20 ft without assistance, severe gait deviations or imbalance, deviates greater than 15 in outside 12 in walkway  width or will not attempt task.    Ambulating Backwards Walks 20 ft, slow speed, abnormal gait pattern, evidence for imbalance, deviates 10-15 in outside 12 in walkway width.    Steps Alternating feet, must use rail.    Total Score 16    FGA comment: 16/30 increased falls risk                 Vestibular Assessment - 12/19/20 1344       Positional Sensitivities   Sit to Supine No dizziness    Supine to Left Side No dizziness    Supine to Right Side No dizziness    Supine to Sitting No dizziness    Right Hallpike No dizziness    Up from Right Hallpike No dizziness    Up from Left Hallpike No dizziness    Nose to Right Knee No dizziness    Right Knee to Sitting No dizziness    Nose to Left Knee No dizziness    Left Knee to Sitting No dizziness    Head Turning x 5 Mild dizziness   rocking   Head Nodding x 5 Mild dizziness   rocking   Pivot Right in Standing Mild dizziness    Pivot Left in Standing Mild dizziness    Rolling Right No dizziness    Rolling Left No dizziness                       Vestibular Treatment/Exercise - 12/19/20 1402       Vestibular Treatment/Exercise   Vestibular Treatment Provided Gaze    Gaze Exercises X1 Viewing Horizontal;X1 Viewing Vertical      X1 Viewing Horizontal   Foot Position standing    Reps 2    Comments 30 seconds, bilat UE support > no UE support > 1 finger touching for support      X1 Viewing Vertical   Foot Position standing    Reps 1    Comments 30 seconds, finger touch for support                    PT Education - 12/19/20 2111     Education Details balance and MSQ clinical findings, initiated HEP, ongoing discussion about purpose of ENT vestibular testing but main option for treatment if vestibular rehab; continued to discuss differential diagnoses including PPPD vs. hypofunction, treatment techniques for improved sensory integration and decrease use of maladaptive gait and balance strategies     Person(s) Educated Patient    Methods Explanation;Demonstration;Handout    Comprehension Verbalized understanding;Returned demonstration              PT Short Term  Goals - 12/03/20 1431       PT SHORT TERM GOAL #1   Title Pt will participate in further assessment of vestibular function with MSQ and SOT    Time 4    Period Weeks    Status New    Target Date 01/02/21      PT SHORT TERM GOAL #2   Title Pt will participate in assessment of falls risk during gait with FGA    Time 4    Period Weeks    Status New    Target Date 01/02/21      PT SHORT TERM GOAL #3   Title Pt will demonstrate resolution of R posterior canal cupulolithiasis    Time 4    Period Weeks    Status New    Target Date 01/02/21      PT SHORT TERM GOAL #4   Title Pt will demonstrate independence with initial HEP    Time 4    Period Weeks    Status New    Target Date 01/02/21               PT Long Term Goals - 12/03/20 1437       PT LONG TERM GOAL #1   Title Pt will demonstrate independence with final balance and vestibular HEP    Time 8    Period Weeks    Status New    Target Date 02/01/21      PT LONG TERM GOAL #2   Title Pt will demonstrate improved VOR as indicated by 2 line difference on DVA    Baseline 3 lines; 7,4    Time 8    Period Weeks    Status New    Target Date 02/01/21      PT LONG TERM GOAL #3   Title Pt will demonstrate 4 point improvement on FGA to indicate decreased falls risk    Time 8    Period Weeks    Status New    Target Date 02/01/21      PT LONG TERM GOAL #4   Title Pt will demonstrate 8 point improvement on SOT    Time 8    Period Weeks    Status New    Target Date 02/01/21      PT LONG TERM GOAL #5   Title Pt will be able to negotiate 8 stairs and ambulate >1,000 outside over various terrain independently without sensation of instability.    Time 8    Period Weeks    Status New    Target Date 02/01/21                   Plan -  12/19/20 2117     Clinical Impression Statement Continued assessment of motion sensitivity and dynamic balance impairments.  Pt demonstrated disequilibrium and sensitivity to vertical and horizontal head movements in unsupported standing and when pivoting.  Pt also demonstrated increased risk for falls during dynamic gait tasks, especially when vision removed and narrow BOS.  Initiated HEP focusing on gaze stabilization.  Will continue to address and progress towards LTG.    Personal Factors and Comorbidities Fitness;Past/Current Experience;Time since onset of injury/illness/exacerbation    Examination-Activity Limitations Locomotion Level;Stairs;Stand    Examination-Participation Restrictions Community Activity;Shop    Stability/Clinical Decision Making Stable/Uncomplicated    Rehab Potential Good    PT Frequency 2x / week    PT Duration 8 weeks    PT Treatment/Interventions ADLs/Self Care Home  Management;Canalith Repostioning;Gait training;Stair training;Functional mobility training;Therapeutic activities;Therapeutic exercise;DME Instruction;Neuromuscular re-education;Balance training;Patient/family education;Vestibular    PT Next Visit Plan Perform SOT.  Progress HEP; add corner balance - EC, unstable surface, VOR without UE support.  Pt wishes to not treat R cupulolithiasis at this time.    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 [place], [place], [address] Phone: [phone]   Fax:  [phone]  Name: Ann Hill MRN: [id number] Date of Birth: 1956-01-26

## 2020-12-22 ENCOUNTER — Encounter: Payer: Self-pay | Admitting: Physical Therapy

## 2020-12-22 ENCOUNTER — Ambulatory Visit: Payer: 59 | Admitting: Physical Therapy

## 2020-12-22 ENCOUNTER — Telehealth: Payer: Self-pay | Admitting: Neurology

## 2020-12-22 ENCOUNTER — Other Ambulatory Visit: Payer: Self-pay

## 2020-12-22 DIAGNOSIS — R42 Dizziness and giddiness: Secondary | ICD-10-CM | POA: Diagnosis not present

## 2020-12-22 DIAGNOSIS — R262 Difficulty in walking, not elsewhere classified: Secondary | ICD-10-CM

## 2020-12-22 DIAGNOSIS — R2681 Unsteadiness on feet: Secondary | ICD-10-CM

## 2020-12-22 MED ORDER — SERTRALINE HCL 25 MG PO TABS: 25.0000 mg | ORAL_TABLET | Freq: Every day | ORAL | 5 refills | Status: DC

## 2020-12-22 NOTE — Patient Instructions (Signed)
Gaze Stabilization: Standing Feet Apart    Feet shoulder width apart; keeping eyes on target on wall _3___ feet away, tilt head down 15-30 and move head side to side for __30__ seconds, increasing speed slightly.  Repeat while moving head up and down for __30__ seconds, increasing speed slightly. Rest and repeat. Do ___2_ sessions per day.   Feet Heel-Toe "Tandem"    Standing with two fingers touching your counter, walk forwards, heel to toe, pause and see if you can lift your fingers off the counter for a few seconds.  Keep eyes straight ahead. Complete the L down and back 2 times. Do __2__ sessions per day.   Retro Walk    Standing next to your counter, walk backwards along the "L" with controlled steps.  Try not to touch the counter if able. Complete the "L" down and back, 2 times.  2 times a day.    Feet Together- Eyes Closed    Stand with your back to the corner of your countertops. With eyes closed and feet together, focus on keeping the pressure in the ball of your feet and equal between feet. Hold for 30 seconds. Repeat __2__ times per session. Do ___2_ sessions per day.

## 2020-12-22 NOTE — Telephone Encounter (Signed)
balance and MSQ clinical findings, initiated HEP, ongoing discussion about purpose of ENT vestibular testing but main option for treatment if vestibular rehab;   continued to discuss differential diagnoses including PPPD vs. hypofunction, treatment techniques for improved sensory integration and decrease use of maladaptive gait and balance strategies       Person(s) Educated Patient     Methods Explanation;Demonstration;Handout     Comprehension Verbalized understanding;Returned demonstration               This was [employer] summary of vestibular therapy need and findings . If there is an anxiety component , treatment with an SSRI or with low dose xanax ( only prn) can be entertained.  I would usually initiate Zoloft at 25 mg as a starting medication.   Larey Seat, MD

## 2020-12-22 NOTE — Therapy (Signed)
[facility] [address] [place], [place], [address] Phone: [phone]   Fax:  [phone]  Physical Therapy Treatment  Patient Details  Name: Ann Hill MRN: [id number] Date of Birth: 12-Jul-1955 Referring Provider (PT): Dohmeier, Asencion Partridge, MD   Encounter Date: 12/22/2020   PT End of Session - 12/22/20 1322     Visit Number 3    Number of Visits 17    Date for PT Re-Evaluation 02/01/21    Authorization Type UHC 2022    PT Start Time 1322    PT Stop Time 1406    PT Time Calculation (min) 44 min    Activity Tolerance Patient tolerated treatment well    Behavior During Therapy [facility] for tasks assessed/performed             Past Medical History:  Diagnosis Date   Calculus of gallbladder with acute cholecystitis 03/16/2014   Dizziness and giddiness    Family history of adverse reaction to anesthesia    Sister would get sick   Rash of entire body since October 2015 03/16/2014    Past Surgical History:  Procedure Laterality Date   BREAST LUMPECTOMY     Bilateral   BUNIONECTOMY     CHOLECYSTECTOMY N/A 03/16/2014   Procedure: SINGLE SITE LAPAROSCOPIC CHOLECYSTECTOMY WITH INTRAOPERATIVE CHOLANGIOGRAM;  Surgeon: Michael Boston, MD;  Location: [facility];  Service: General;  Laterality: N/A;   ENDOSCOPIC RETROGRADE CHOLANGIOPANCREATOGRAPHY (ERCP) WITH PROPOFOL N/A 03/18/2014   Procedure: ENDOSCOPIC RETROGRADE CHOLANGIOPANCREATOGRAPHY (ERCP) WITH PROPOFOL;  Surgeon: Missy Sabins, MD;  Location: [facility];  Service: Endoscopy;  Laterality: N/A;   LAPAROSCOPIC CHOLECYSTECTOMY SINGLE SITE WITH INTRAOPERATIVE CHOLANGIOGRAM  03/16/2014    There were no vitals filed for this visit.   Subjective Assessment - 12/22/20 1323     Subjective Pt reports being concerned after last session after being told she is considered a fall risk. Pt reports hearing pops and cracks while doing her head turn exercises. Pt reports not having to use  the chair while doing her exercises today.    Pertinent History No significant PMH    Limitations Standing;Walking    Diagnostic tests MRI - no acute abnormalities    Patient Stated Goals To be more stable and be able to go for a walk; to not have anxiety about walking around in the community    Currently in Pain? No/denies                                Vestibular Treatment/Exercise - 12/22/20 0001       Vestibular Treatment/Exercise   Vestibular Treatment Provided Gaze    Gaze Exercises X1 Viewing Horizontal;X1 Viewing Vertical      X1 Viewing Horizontal   Foot Position [place]    Reps 2    Comments 30 seconds, no UE support, 1x slow head turns, 1x faster head turns      X1 Viewing Vertical   Foot Position standing    Reps 2    Comments 30 seconds, no UE support, 1x slower head turns, 1x faster head turns                Balance Exercises - 12/22/20 0001       Balance Exercises: Standing   Standing Eyes Closed Narrow base of support (BOS);Solid surface;Other (comment);30 secs;3 reps;Limitations   Pt peformed standing in corner with narrow base of support x30 seconds w/  EC. After each repetition pt was asked to make base of support more narrow until pt's heels touched for the last set. PT provided min guard/supervision.   Tandem Gait Other (comment)   pt performed tandem walking with two finger UE support. PT cued pt to pause after each step to emphasize finding balance. x2 Laps PT cued pt to lift fingers off counter once step is complete x1 Lap. PT provided supervision.   Retro Gait Other (comment)   Pt performed backwards walking at the counter w/ emphasis on going slow and feeling the weight shift from side to side x2 Laps. Pt used single UE assist at counter w/ PT providing supervision.               PT Education - 12/22/20 1403     Education Details PT provided pt education on adaptations to make to her daily life such as using a  nightlight and avoid turning head while talking and walking friends to avoid falls and ease pt's concern of her being a fall risk due to her vestibular hypofunction. PT updated and provided pt a new HEP with focus on balance activities and gaze stablization activities.    Person(s) Educated Patient    Methods Explanation;Handout    Comprehension Verbalized understanding              PT Short Term Goals - 12/03/20 1431       PT SHORT TERM GOAL #1   Title Pt will participate in further assessment of vestibular function with MSQ and SOT    Time 4    Period Weeks    Status New    Target Date 01/02/21      PT SHORT TERM GOAL #2   Title Pt will participate in assessment of falls risk during gait with FGA    Time 4    Period Weeks    Status New    Target Date 01/02/21      PT SHORT TERM GOAL #3   Title Pt will demonstrate resolution of R posterior canal cupulolithiasis    Time 4    Period Weeks    Status New    Target Date 01/02/21      PT SHORT TERM GOAL #4   Title Pt will demonstrate independence with initial HEP    Time 4    Period Weeks    Status New    Target Date 01/02/21               PT Long Term Goals - 12/03/20 1437       PT LONG TERM GOAL #1   Title Pt will demonstrate independence with final balance and vestibular HEP    Time 8    Period Weeks    Status New    Target Date 02/01/21      PT LONG TERM GOAL #2   Title Pt will demonstrate improved VOR as indicated by 2 line difference on DVA    Baseline 3 lines; 7,4    Time 8    Period Weeks    Status New    Target Date 02/01/21      PT LONG TERM GOAL #3   Title Pt will demonstrate 4 point improvement on FGA to indicate decreased falls risk    Time 8    Period Weeks    Status New    Target Date 02/01/21      PT LONG TERM GOAL #4   Title Pt will  demonstrate 8 point improvement on SOT    Time 8    Period Weeks    Status New    Target Date 02/01/21      PT LONG TERM GOAL #5   Title Pt  will be able to negotiate 8 stairs and ambulate >1,000 outside over various terrain independently without sensation of instability.    Time 8    Period Weeks    Status New    Target Date 02/01/21                   Plan - 12/22/20 1412     Clinical Impression Statement Pt presents to the clinic today with the ability to perform gaze stabilization exercise with slight incresaed speed in head turns. Pt reported increased symptoms with increased head turning speeds and presented w/ increased sway in balance activities that incorporate a smaller base of support and EC. PT introduced activities to improve pt's balance to facilitate improved community ambulation and funcitonal mobility. PT provided and updated HEP w/ balance activities performed in today's session. Pt tolerated therapy well w/ no adverse s/s at the conclusion of session. PT will continue to progress pt as tolerated to continue to improve the deficits state above and achieve pt's LTG.    Personal Factors and Comorbidities Fitness;Past/Current Experience;Time since onset of injury/illness/exacerbation    Examination-Activity Limitations Locomotion Level;Stairs;Stand    Examination-Participation Restrictions Community Activity;Shop    Stability/Clinical Decision Making Stable/Uncomplicated    Rehab Potential Good    PT Frequency 2x / week    PT Duration 8 weeks    PT Treatment/Interventions ADLs/Self Care Home Management;Canalith Repostioning;Gait training;Stair training;Functional mobility training;Therapeutic activities;Therapeutic exercise;DME Instruction;Neuromuscular re-education;Balance training;Patient/family education;Vestibular    PT Next Visit Plan Perform SOT.  Progress HEP - EC, unstable surface, VOR increase time to 60 seconds.  Pt wishes to not treat R cupulolithiasis at this time.    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

## 2020-12-23 ENCOUNTER — Encounter: Payer: Self-pay | Admitting: Neurology

## 2020-12-23 NOTE — Procedures (Signed)
[facility] TEST REPORT ( by Watch PAT)   STUDY DATE:  12-22-2020 DOB:  1955/12/24    ORDERING CLINICIAN: Larey Seat, MD  REFERRING CLINICIAN:    CLINICAL INFORMATION/HISTORY:  Ann Hill is a 65 y.o. female here as a referral from Dr. Florene Glen for gait instability. She also was found to have elevated hemoglobin and hematocrit, and did not travel to high altitudes, has no history of lung or heart diease.    Mrs. Conger reported that her first awareness of gait instability occurred about 7 years ago at the end of a long walk she felt unstable, she did not fall, but she had a sensation that she was off balance.  This instability has gradually progressed soon after she noted difficulties to step on an escalator the moving surface was making her uncomfortable.  After that she noted an instability or fear when walking over shiny floors, and then when descending a long set of stairs.  Open spaces have now also given her an uneasy feeling and she is better when she has a rail to hold onto or another person to support her.  Until now, she has never fallen ,she has not passed out , she had her vision checked and she does deny any kind of dizziness but just being seated or in daily occurrences.  No instability at home on stairs, holding on to rail. No trouble walking in the dark.   She has had a spontaneous fracture of the right femur- April 2022! Has an effusion in the knee.  Elevated Hematocrit and hemoglobin noted on last CBC, diff, metabolic panel with Dr. Florene Glen.    Epworth sleepiness score: N/A  /24. BMI: 30.7 kg/m Neck Circumference: 16"  Sleep Summary: The total recording time for this home sleep test amounted to 9 hours 53 minutes of which 8 hours and 19 minutes were calculated sleep time.        Percent REM (%): 26%                                      Respiratory Indices:   Calculated pAHI (per hour):   The apnea hypopnea index per hour was 6.9, during REM  sleep the AHI was at 10.7/h and during non-REM sleep at 5.5/h.                        Positional AHI: In supine sleep the AHI was 15.3 /h and in nonsupine sleep 6.8/h- the patient mostly slept on her left side but not in supine position.    Snoring data reveal a so-called mean volume of 40 dB but only present for less than 5% of the total sleep time.  So the 40 dB can well be artifact.  It is not [place] to me if the patient snores at all.                                              Oxygen Saturation Statistics:  O2 Saturation Range (%):       Varied between a nadir of 81% and a maximum of 97% with a mean oxygenation saturation of 93%.  O2 Saturation (minutes) <89%: 0 minutes          Pulse Rate Statistics:    Pulse Range:   Varied between 45 and 86 bpm with a mean heart rate of 56 bpm.              IMPRESSION:  This HST confirms the presence of very mild sleep apnea that seems to be only present during REM sleep and in supine sleep.  Avoiding supine sleep alone is likely to treat this apnea sufficiently.  There was no associated hypoxemia that could explain the higher hematocrit and hemoglobin levels the patient had been diagnosed with.   RECOMMENDATION: Positional sleep habits should be changed away from a supine sleep position to a lateral sleep habit.  We can sometimes use the tennis ball method and some patients will invest in a sleep balance device by [employer].   Both methods are meant to remind the patient not to find himself on the back inadvertently at night.    INTERPRETING PHYSICIAN: Larey Seat, MD , Board certified by the ABPN and [place] Director of [facility].

## 2020-12-23 NOTE — Progress Notes (Signed)
IMPRESSION:  This HST confirms the presence of very mild sleep apnea that seems to be only present during REM sleep and in supine sleep.  Avoiding supine sleep alone is likely to treat this apnea sufficiently.  There was no associated hypoxemia that could explain the higher hematocrit and hemoglobin levels the patient had been diagnosed with.  RECOMMENDATION: Positional sleep habits should be changed away from a supine sleep position to a lateral sleep habit.  We can sometimes use the tennis ball method and some patients will invest in a sleep balance device by [employer].   Both methods are meant to remind the patient not to find him/ herself on the back inadvertently at night.  Cc Dr Florene Glen.

## 2020-12-25 NOTE — Telephone Encounter (Signed)
Increased blood triglyceride levels during sertraline treatment might have been due to increased insulin secretion and insulin's anabolic effects. Results of several studies show that long-term use of Zoloft can have an adverse effect on cardiovascular risk and increase LDL cholesterol, leading to recommend to monitor the cholesterol levels in their patients taking Zoloft.  Dear Mrs. Fromme, These studies looked at long term risk an also stated that co-morbidities play a role, especially obesity.  There is a similar concern for celexa, effexor, and even wellbutrin.  All these meds are used off label in gait disorders, balance.

## 2020-12-26 ENCOUNTER — Ambulatory Visit: Payer: 59 | Admitting: Physical Therapy

## 2020-12-26 ENCOUNTER — Other Ambulatory Visit: Payer: Self-pay

## 2020-12-26 DIAGNOSIS — R42 Dizziness and giddiness: Secondary | ICD-10-CM | POA: Diagnosis not present

## 2020-12-26 DIAGNOSIS — R2681 Unsteadiness on feet: Secondary | ICD-10-CM

## 2020-12-26 NOTE — Patient Instructions (Addendum)
Gaze Stabilization: Standing Feet Apart    Feet shoulder width apart; keeping eyes on target on wall _3___ feet away, tilt head down 15-30 and move head side to side for __60__ seconds, increasing speed slightly.  Repeat while moving head up and down for __60__ seconds, increasing speed slightly. Rest and repeat. Do ___2_ sessions per day.   Feet Heel-Toe "Tandem"    Standing with two fingers touching your counter, walk forwards, heel to toe, pause and see if you can lift your fingers off the counter for a few seconds.  Keep eyes straight ahead. Complete the L down and back 2 times. Do __2__ sessions per day.   Retro Walk    Standing next to your counter, walk backwards along the "L" with controlled steps.  Try not to touch the counter if able. Complete the "L" down and back, 2 times.  2 times a day.    Feet Together- Eyes Closed    Stand with your back to the corner of your countertops. With eyes closed and feet together, focus on keeping the pressure in the ball of your feet and equal between feet. Hold for 30 seconds. Repeat __2__ times per session. Do ___2_ sessions per day.

## 2020-12-28 NOTE — Therapy (Signed)
[facility] [address] [place], [place], [address] Phone: [phone]   Fax:  [phone]  Physical Therapy Treatment  Patient Details  Name: Ann Hill MRN: [id number] Date of Birth: 1955-07-09 Referring Provider (PT): Dohmeier, Asencion Partridge, MD   Encounter Date: 12/26/2020   PT End of Session - 12/28/20 1620     Visit Number 4    Number of Visits 17    Date for PT Re-Evaluation 02/01/21    Authorization Type UHC 2022    PT Start Time 1315    PT Stop Time 1400    PT Time Calculation (min) 45 min    Activity Tolerance Patient tolerated treatment well    Behavior During Therapy [facility] for tasks assessed/performed             Past Medical History:  Diagnosis Date   Calculus of gallbladder with acute cholecystitis 03/16/2014   Dizziness and giddiness    Family history of adverse reaction to anesthesia    Sister would get sick   Rash of entire body since October 2015 03/16/2014    Past Surgical History:  Procedure Laterality Date   BREAST LUMPECTOMY     Bilateral   BUNIONECTOMY     CHOLECYSTECTOMY N/A 03/16/2014   Procedure: SINGLE SITE LAPAROSCOPIC CHOLECYSTECTOMY WITH INTRAOPERATIVE CHOLANGIOGRAM;  Surgeon: Michael Boston, MD;  Location: [facility];  Service: General;  Laterality: N/A;   ENDOSCOPIC RETROGRADE CHOLANGIOPANCREATOGRAPHY (ERCP) WITH PROPOFOL N/A 03/18/2014   Procedure: ENDOSCOPIC RETROGRADE CHOLANGIOPANCREATOGRAPHY (ERCP) WITH PROPOFOL;  Surgeon: Missy Sabins, MD;  Location: [facility];  Service: Endoscopy;  Laterality: N/A;   LAPAROSCOPIC CHOLECYSTECTOMY SINGLE SITE WITH INTRAOPERATIVE CHOLANGIOGRAM  03/16/2014    There were no vitals filed for this visit.    12/26/20 1321  Symptoms/Limitations  Subjective Still not feeling confident; not needing as much UE support for exercises but by second set pt feels more sway.  Still having a hard time with eyes closed.  Was walking up her drive way to get the  mail and turned her head to look to the side and when she look forwards she had a brief spell of dizziness.  Decided to not take the Zoloft due to side effects of high cholesterol.  Pertinent History No significant PMH  Limitations Standing;Walking  Diagnostic tests MRI - no acute abnormalities  Patient Stated Goals To be more stable and be able to go for a walk; to not have anxiety about walking around in the community  Pain Assessment  Currently in Pain? No/denies     12/26/20 1352  Ambulation/Gait  Ramp 4: Min assist;5: Supervision;Other (comment)  Ramp Details (indicate cue type and reason) facing up and down ramp, performed alternating stepping forwards x 10 reps and then backwards x 10 reps with min A for balance and safety     12/26/20 1335  Balance Exercises: Standing  Marching Solid surface;Static;10 reps;Head turns;Limitations  Marching Limitations performed solid surface with walls around patient x 2 sets with slow marching and then fast marching.  Then performed on level surface in the open without walls.  Performed standing on top of ramp looking out over gym with close supervision-min A of therapist.  Sit to Stand Standard surface;Without upper extremity support;Upper extremity support;Other (comment) (with eyes closed x 8 reps)     12/26/20 1330  Vestibular Treatment/Exercise  Vestibular Treatment Provided Gaze  Gaze Exercises X1 Viewing Horizontal;X1 Viewing Vertical  X1 Viewing Horizontal  Foot Position standing, feet apart, solid surface,  plain background  Reps 2  Comments increased to 60 seconds, no UE support, mild symptoms  X1 Viewing Vertical  Foot Position standing, feet apart, solid surface, plain background  Reps 2  Comments increased to 60 seconds, no UE support, mild symptoms - same as side to side       12/26/20 1402  PT Education  Education Details increased x1 viewing to 60 seconds; walking with husband but not to the point of "white knuckle" -  turn around before feeling uneasy to keep a sense of safety - will gradually increase distance  Person(s) Educated Patient  Methods Explanation  Comprehension Verbalized understanding     PT Short Term Goals - 12/03/20 1431       PT SHORT TERM GOAL #1   Title Pt will participate in further assessment of vestibular function with MSQ and SOT    Time 4    Period Weeks    Status New    Target Date 01/02/21      PT SHORT TERM GOAL #2   Title Pt will participate in assessment of falls risk during gait with FGA    Time 4    Period Weeks    Status New    Target Date 01/02/21      PT SHORT TERM GOAL #3   Title Pt will demonstrate resolution of R posterior canal cupulolithiasis    Time 4    Period Weeks    Status New    Target Date 01/02/21      PT SHORT TERM GOAL #4   Title Pt will demonstrate independence with initial HEP    Time 4    Period Weeks    Status New    Target Date 01/02/21               PT Long Term Goals - 12/03/20 1437       PT LONG TERM GOAL #1   Title Pt will demonstrate independence with final balance and vestibular HEP    Time 8    Period Weeks    Status New    Target Date 02/01/21      PT LONG TERM GOAL #2   Title Pt will demonstrate improved VOR as indicated by 2 line difference on DVA    Baseline 3 lines; 7,4    Time 8    Period Weeks    Status New    Target Date 02/01/21      PT LONG TERM GOAL #3   Title Pt will demonstrate 4 point improvement on FGA to indicate decreased falls risk    Time 8    Period Weeks    Status New    Target Date 02/01/21      PT LONG TERM GOAL #4   Title Pt will demonstrate 8 point improvement on SOT    Time 8    Period Weeks    Status New    Target Date 02/01/21      PT LONG TERM GOAL #5   Title Pt will be able to negotiate 8 stairs and ambulate >1,000 outside over various terrain independently without sensation of instability.    Time 8    Period Weeks    Status New    Target Date 02/01/21                    Plan - 12/28/20 1620     Clinical Impression Statement Due to ongoing progress, continued to progress gaze  stabilization by increasing time.  Pt reporting dizziness when performing walking with head turns; initiated more dynamic balance training with marching with head turns beginning in enclosed space and progressing to open, elevated area with pt reporting mild symptoms and requiring intermittent UE support for balance.  Also continued to perform balance challenges on inclined surfaces.  Pt reported mild symptoms overall.  Will continue to address and progress towards LTG.    Personal Factors and Comorbidities Fitness;Past/Current Experience;Time since onset of injury/illness/exacerbation    Examination-Activity Limitations Locomotion Level;Stairs;Stand    Examination-Participation Restrictions Community Activity;Shop    Stability/Clinical Decision Making Stable/Uncomplicated    Rehab Potential Good    PT Frequency 2x / week    PT Duration 8 weeks    PT Treatment/Interventions ADLs/Self Care Home Management;Canalith Repostioning;Gait training;Stair training;Functional mobility training;Therapeutic activities;Therapeutic exercise;DME Instruction;Neuromuscular re-education;Balance training;Patient/family education;Vestibular    PT Next Visit Plan Progress HEP - EC, unstable surface, VOR progress to narrow BOS.  Balance on inclines.  Rockerboard.  Marching in place with head turns > walking with head turns.  Pt wishes to not treat R cupulolithiasis at this time.    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 [place], [place], [address] Phone: [phone]   Fax:  [phone]  Name: Ann Hill MRN: [id number] Date of Birth: 05-Jan-1956

## 2020-12-29 ENCOUNTER — Ambulatory Visit: Payer: 59 | Admitting: Physical Therapy

## 2020-12-29 ENCOUNTER — Other Ambulatory Visit: Payer: Self-pay

## 2020-12-29 DIAGNOSIS — R2681 Unsteadiness on feet: Secondary | ICD-10-CM

## 2020-12-29 DIAGNOSIS — R42 Dizziness and giddiness: Secondary | ICD-10-CM

## 2020-12-29 DIAGNOSIS — R262 Difficulty in walking, not elsewhere classified: Secondary | ICD-10-CM

## 2020-12-29 NOTE — Patient Instructions (Signed)
Gaze Stabilization: Standing Feet Apart    Feet shoulder width apart; keeping eyes on target on wall _3___ feet away, tilt head down 15-30 and move head side to side for _ 30__ seconds, increasing speed slightly.  Repeat while moving head up and down for __30__ seconds, increasing speed slightly. Rest and repeat. Do ___2_ sessions per day.   Feet Heel-Toe "Tandem"    Standing with two fingers touching your counter, walk forwards, heel to toe, pause and see if you can lift your fingers off the counter for a few seconds.  Keep eyes straight ahead. Complete the L down and back 2 times. Do __2__ sessions per day.   Retro Walk    Standing next to your counter, walk backwards along the "L" with controlled steps.  Try not to touch the counter if able. Complete the "L" down and back, 2 times.  2 times a day.    Feet Together- Eyes Closed    Stand with your back to the corner of your countertops. With eyes closed and feet together, focus on keeping the pressure in the ball of your feet and equal between feet. Hold for 30 seconds. Repeat __2__ times per session. Do ___2_ sessions per day.

## 2020-12-29 NOTE — Therapy (Signed)
[facility] [address] [place], [place], [address] Phone: [phone]   Fax:  [phone]  Physical Therapy Treatment  Patient Details  Name: Ann Hill MRN: [id number] Date of Birth: 07/12/1955 Referring Provider (PT): Dohmeier, Asencion Partridge, MD   Encounter Date: 12/29/2020   PT End of Session - 12/29/20 1419     Visit Number 5    Number of Visits 17    Date for PT Re-Evaluation 02/01/21    Authorization Type UHC 2022    PT Start Time 1319    PT Stop Time 1400    PT Time Calculation (min) 41 min    Activity Tolerance Patient tolerated treatment well    Behavior During Therapy [facility] for tasks assessed/performed             Past Medical History:  Diagnosis Date   Calculus of gallbladder with acute cholecystitis 03/16/2014   Dizziness and giddiness    Family history of adverse reaction to anesthesia    Sister would get sick   Rash of entire body since October 2015 03/16/2014    Past Surgical History:  Procedure Laterality Date   BREAST LUMPECTOMY     Bilateral   BUNIONECTOMY     CHOLECYSTECTOMY N/A 03/16/2014   Procedure: SINGLE SITE LAPAROSCOPIC CHOLECYSTECTOMY WITH INTRAOPERATIVE CHOLANGIOGRAM;  Surgeon: Michael Boston, MD;  Location: [facility];  Service: General;  Laterality: N/A;   ENDOSCOPIC RETROGRADE CHOLANGIOPANCREATOGRAPHY (ERCP) WITH PROPOFOL N/A 03/18/2014   Procedure: ENDOSCOPIC RETROGRADE CHOLANGIOPANCREATOGRAPHY (ERCP) WITH PROPOFOL;  Surgeon: Missy Sabins, MD;  Location: [facility];  Service: Endoscopy;  Laterality: N/A;   LAPAROSCOPIC CHOLECYSTECTOMY SINGLE SITE WITH INTRAOPERATIVE CHOLANGIOGRAM  03/16/2014    There were no vitals filed for this visit.   Subjective Assessment - 12/29/20 1321     Subjective Didn't have a good weekend - was very off balance performing 60 seconds VOR and wasn't able to walk with husband.  Has tried breathing with eyes closed exercise.  Asking to send eval to PCP before  appointment.    Pertinent History No significant PMH    Limitations Standing;Walking    Diagnostic tests MRI - no acute abnormalities    Patient Stated Goals To be more stable and be able to go for a walk; to not have anxiety about walking around in the community              Balance Exercises - 12/29/20 1344       Balance Exercises: Standing   Balance Beam Standing across balance beam: SLS on L and R LE x 10 seconds with gaze forwards x 2 reps each side; then added head turns to L and R x 10, up and down x 10 each LE with UE support.  Performed alternating stepping forwards and back up to balance beam x 10 reps with UE support and then retro stepping off balance beam x 10 reps alternating.  Standing tandem stance with each LE on balance beam x 10 seconds without UE support, gaze forwards x 2 reps each foot forwards with close supervision, able to release UE support.    Gait with Head Turns Forward;Upper extremity support;4 reps;Limitations    Gait with Head Turns Limitations head turns L and R every 3 steps, horizontal only, use of hallway for enclosed spaces    Retro Gait Upper extremity support;4 reps;Limitations    Retro Gait Limitations in hallway for enclosed space    Marching Solid surface;Head turns;Static;Limitations    Marching  Limitations 2 sets x 3 head turns to each direction, returned to hallway for enclosed space                PT Education - 12/29/20 1412     Education Details normalized patient's experience that progression is not in a linear progression and can be up/down; reduced VOR back down to 30 seconds and recommended 1x/day if pt plans to walk with husband.  Greatest weakness appears to be on R side.  Continued to educate pt on BPPV treatment; pt continues to defer treatment for BPPV    Person(s) Educated Patient    Methods Explanation    Comprehension Verbalized understanding              PT Short Term Goals - 12/03/20 1431       PT SHORT TERM  GOAL #1   Title Pt will participate in further assessment of vestibular function with MSQ and SOT    Time 4    Period Weeks    Status New    Target Date 01/02/21      PT SHORT TERM GOAL #2   Title Pt will participate in assessment of falls risk during gait with FGA    Time 4    Period Weeks    Status New    Target Date 01/02/21      PT SHORT TERM GOAL #3   Title Pt will demonstrate resolution of R posterior canal cupulolithiasis    Time 4    Period Weeks    Status New    Target Date 01/02/21      PT SHORT TERM GOAL #4   Title Pt will demonstrate independence with initial HEP    Time 4    Period Weeks    Status New    Target Date 01/02/21               PT Long Term Goals - 12/03/20 1437       PT LONG TERM GOAL #1   Title Pt will demonstrate independence with final balance and vestibular HEP    Time 8    Period Weeks    Status New    Target Date 02/01/21      PT LONG TERM GOAL #2   Title Pt will demonstrate improved VOR as indicated by 2 line difference on DVA    Baseline 3 lines; 7,4    Time 8    Period Weeks    Status New    Target Date 02/01/21      PT LONG TERM GOAL #3   Title Pt will demonstrate 4 point improvement on FGA to indicate decreased falls risk    Time 8    Period Weeks    Status New    Target Date 02/01/21      PT LONG TERM GOAL #4   Title Pt will demonstrate 8 point improvement on SOT    Time 8    Period Weeks    Status New    Target Date 02/01/21      PT LONG TERM GOAL #5   Title Pt will be able to negotiate 8 stairs and ambulate >1,000 outside over various terrain independently without sensation of instability.    Time 8    Period Weeks    Status New    Target Date 02/01/21                   Plan - 12/29/20 1419  Clinical Impression Statement Pt did not tolerate progression of VOR last session; returned to previous VOR instructions so that pt can continue to walk without significant symptoms.  Continued to  progress dynamic balance with head turns by progressing to walking in enclosed area, retro gait, and use of balance beam and compliant surface.  Pt continues to require UE support and intermittent min A for balance.  Will assess STG next session.    Personal Factors and Comorbidities Fitness;Past/Current Experience;Time since onset of injury/illness/exacerbation    Examination-Activity Limitations Locomotion Level;Stairs;Stand    Examination-Participation Restrictions Community Activity;Shop    Stability/Clinical Decision Making Stable/Uncomplicated    Rehab Potential Good    PT Frequency 2x / week    PT Duration 8 weeks    PT Treatment/Interventions ADLs/Self Care Home Management;Canalith Repostioning;Gait training;Stair training;Functional mobility training;Therapeutic activities;Therapeutic exercise;DME Instruction;Neuromuscular re-education;Balance training;Patient/family education;Vestibular    PT Next Visit Plan Progress HEP - EC, unstable surface, VOR progress to 45 seconds; balance on inclines, balance beam, Rockerboard.  walking with head turns in more open areas.  Pt wishes to not treat R cupulolithiasis at this time.    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 [place], [place], [address] Phone: [phone]   Fax:  [phone]  Name: Ann Hill MRN: [id number] Date of Birth: 07-06-1955

## 2021-01-02 ENCOUNTER — Other Ambulatory Visit: Payer: Self-pay

## 2021-01-02 ENCOUNTER — Ambulatory Visit: Payer: 59 | Admitting: Physical Therapy

## 2021-01-02 DIAGNOSIS — R2681 Unsteadiness on feet: Secondary | ICD-10-CM

## 2021-01-02 DIAGNOSIS — R42 Dizziness and giddiness: Secondary | ICD-10-CM

## 2021-01-02 DIAGNOSIS — R262 Difficulty in walking, not elsewhere classified: Secondary | ICD-10-CM

## 2021-01-02 NOTE — Patient Instructions (Addendum)
Gaze Stabilization: Sitting    Keeping eyes on target on wall 3 feet away, tilt head down 15-30 and move head side to side for __30__ seconds. Repeat while moving head up and down for __30__ seconds. Do __2__ sessions per day. If you feel safe, try walking around your house after doing the first 30 seconds; come back, do the second 30 seconds and then walk around your house again.   Feet Heel-Toe "Tandem"    Standing with two fingers touching your counter, walk forwards, heel to toe, pause and see if you can lift your fingers off the counter for a few seconds.  Keep eyes straight ahead. Complete the L down and back 2 times. Do __2__ sessions per day.   Retro Walk    Standing next to your counter, walk backwards along the "L" with controlled steps.  Try not to touch the counter if able. Complete the "L" down and back, 2 times.  2 times a day.    Feet Together- Eyes Closed    Stand with your back to the corner of your countertops. With eyes closed and feet together, focus on keeping the pressure in the ball of your feet and equal between feet. Hold for 30 seconds. Repeat __2__ times per session. Do ___2_ sessions per day.

## 2021-01-04 NOTE — Therapy (Signed)
[facility] [address] [place], [place], [address] Phone: [phone]   Fax:  [phone]  Physical Therapy Treatment  Patient Details  Name: Ann Hill MRN: [id number] Date of Birth: 1956-02-04 Referring Provider (PT): Dohmeier, Asencion Partridge, MD   Encounter Date: 01/02/2021   PT End of Session - 01/04/21 1650     Visit Number 6    Number of Visits 17    Date for PT Re-Evaluation 02/01/21    Authorization Type UHC 2022    PT Start Time 1319    PT Stop Time 1403    PT Time Calculation (min) 44 min    Activity Tolerance Patient tolerated treatment well    Behavior During Therapy [facility] for tasks assessed/performed             Past Medical History:  Diagnosis Date   Calculus of gallbladder with acute cholecystitis 03/16/2014   Dizziness and giddiness    Family history of adverse reaction to anesthesia    Sister would get sick   Rash of entire body since October 2015 03/16/2014    Past Surgical History:  Procedure Laterality Date   BREAST LUMPECTOMY     Bilateral   BUNIONECTOMY     CHOLECYSTECTOMY N/A 03/16/2014   Procedure: SINGLE SITE LAPAROSCOPIC CHOLECYSTECTOMY WITH INTRAOPERATIVE CHOLANGIOGRAM;  Surgeon: Michael Boston, MD;  Location: [facility];  Service: General;  Laterality: N/A;   ENDOSCOPIC RETROGRADE CHOLANGIOPANCREATOGRAPHY (ERCP) WITH PROPOFOL N/A 03/18/2014   Procedure: ENDOSCOPIC RETROGRADE CHOLANGIOPANCREATOGRAPHY (ERCP) WITH PROPOFOL;  Surgeon: Missy Sabins, MD;  Location: [facility];  Service: Endoscopy;  Laterality: N/A;   LAPAROSCOPIC CHOLECYSTECTOMY SINGLE SITE WITH INTRAOPERATIVE CHOLANGIOGRAM  03/16/2014    There were no vitals filed for this visit.    01/02/21 1327  Symptoms/Limitations  Subjective Had to stop VOR exercise because she still can't get out of the garage to walk and carpets inside are starting to bother her.  Would like to show PT her foot and wonders if that is why she has a hard  time with balance on R side.  Pertinent History No significant PMH  Limitations Standing;Walking  Diagnostic tests MRI - no acute abnormalities  Patient Stated Goals To be more stable and be able to go for a walk; to not have anxiety about walking around in the community  Pain Assessment  Currently in Pain? Yes      01/02/21 1401  Balance Exercises: Standing  Rockerboard Anterior/posterior;EO;Head turns;5 reps;UE support;Limitations  Rockerboard Limitations weight shifting ant/posterior; then performed holding weight forwards and then backwards for prolonged period of time.  Added head turns and nods to prolonged weight shift forwards and backwards; intermittent release of UE support.  Reported a sense of rocking/moving after stepping off rockerboard     01/02/21 1353  Vestibular Treatment/Exercise  Vestibular Treatment Provided Gaze  Gaze Exercises X1 Viewing Horizontal;X1 Viewing Vertical  X1 Viewing Horizontal  Foot Position regressed to seated without back support  Reps 2  Comments regressed to 30 seconds followed by active recovery of ambulating around gym x 1 lap without UE support and then one lap with one trekking pole for increased proprioceptive input and stability; pt reported increased sense of safety/stability with trekking pole  X1 Viewing Vertical  Foot Position regressed to seated without back support  Reps 2  Comments regressed to 30 seconds followed by active recovery of ambulating around gym x 1 lap without UE support and then one lap with one trekking pole for increased  proprioceptive input and stability; pt reported increased sense of safety/stability with trekking pole      PT Education - 01/04/21 1649     Education Details regressed HEP to sitting but used walking around house as active recovery; trekking pole to assist with walking outdoors, benefits of trekking pole, shifting mindset about trekking pole from thinking of it as "going backwards".    Person(s)  Educated Patient    Methods Explanation;Demonstration;Handout    Comprehension Verbalized understanding;Returned demonstration              PT Short Term Goals - 01/04/21 1652       PT SHORT TERM GOAL #1   Title Pt will participate in further assessment of vestibular function with MSQ and SOT    Time 4    Period Weeks    Status Achieved    Target Date 01/02/21      PT SHORT TERM GOAL #2   Title Pt will participate in assessment of falls risk during gait with FGA    Time 4    Period Weeks    Status Achieved    Target Date 01/02/21      PT SHORT TERM GOAL #3   Title Pt will demonstrate resolution of R posterior canal cupulolithiasis    Time 4    Period Weeks    Status Deferred    Target Date 01/02/21      PT SHORT TERM GOAL #4   Title Pt will demonstrate independence with initial HEP    Time 4    Period Weeks    Status On-going    Target Date 01/02/21               PT Long Term Goals - 12/03/20 1437       PT LONG TERM GOAL #1   Title Pt will demonstrate independence with final balance and vestibular HEP    Time 8    Period Weeks    Status New    Target Date 02/01/21      PT LONG TERM GOAL #2   Title Pt will demonstrate improved VOR as indicated by 2 line difference on DVA    Baseline 3 lines; 7,4    Time 8    Period Weeks    Status New    Target Date 02/01/21      PT LONG TERM GOAL #3   Title Pt will demonstrate 4 point improvement on FGA to indicate decreased falls risk    Time 8    Period Weeks    Status New    Target Date 02/01/21      PT LONG TERM GOAL #4   Title Pt will demonstrate 8 point improvement on SOT    Time 8    Period Weeks    Status New    Target Date 02/01/21      PT LONG TERM GOAL #5   Title Pt will be able to negotiate 8 stairs and ambulate >1,000 outside over various terrain independently without sensation of instability.    Time 8    Period Weeks    Status New    Target Date 02/01/21                    Plan - 01/04/21 1652     Clinical Impression Statement Pt was making steady progress towards STG but after PT increased VOR to 60 seconds in standing pt has experienced a regression in progress and has  been unable to ambulate outside with husband and is now reporting busy patterns in her house are causing symptoms.  Pt is tolerating standing balance exercises but has been unable to tolerate recent progression of VOR.  Pt continues to defer treatment of R cupulolithiasis due possible increase in symptoms of vertigo.  Treatment session today focused on regression of VOR exercise to sitting and combining with active recovery -walking in her home or walking with trekking poles to be begin to associate gaze stabilization with sensation of safety and stability.  Pt tolerated balance reaction and postural control training on rockerboard but reported ongoing sensation of movement after ceasing.  Will continue to address and progress towards LTG as pt is able to tolerate.    Personal Factors and Comorbidities Fitness;Past/Current Experience;Time since onset of injury/illness/exacerbation    Examination-Activity Limitations Locomotion Level;Stairs;Stand    Examination-Participation Restrictions Community Activity;Shop    Stability/Clinical Decision Making Stable/Uncomplicated    Rehab Potential Good    PT Frequency 2x / week    PT Duration 8 weeks    PT Treatment/Interventions ADLs/Self Care Home Management;Canalith Repostioning;Gait training;Stair training;Functional mobility training;Therapeutic activities;Therapeutic exercise;DME Instruction;Neuromuscular re-education;Balance training;Patient/family education;Vestibular    PT Next Visit Plan How is she tolerating VOR sitting + walking for recovery?  Trekking poles with walking?  Balance on rockerboard at counter; progress to more open area.  Defers treatment of R cupulo right now.    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

## 2021-01-05 ENCOUNTER — Ambulatory Visit: Payer: 59 | Admitting: Physical Therapy

## 2021-01-05 ENCOUNTER — Other Ambulatory Visit: Payer: Self-pay

## 2021-01-05 DIAGNOSIS — R42 Dizziness and giddiness: Secondary | ICD-10-CM

## 2021-01-05 DIAGNOSIS — R2681 Unsteadiness on feet: Secondary | ICD-10-CM

## 2021-01-05 DIAGNOSIS — R262 Difficulty in walking, not elsewhere classified: Secondary | ICD-10-CM

## 2021-01-05 NOTE — Therapy (Signed)
[facility] [address] [place], [place], [address] Phone: [phone]   Fax:  [phone]  Physical Therapy Treatment  Patient Details  Name: Ann Hill MRN: [id number] Date of Birth: [birth year] Referring Provider (PT): Dohmeier, Asencion Partridge, MD   Encounter Date: 01/05/2021   PT End of Session - 01/05/21 1533     Visit Number 7    Number of Visits 17    Date for PT Re-Evaluation 02/01/21    Authorization Type UHC 2022    PT Start Time 1452    PT Stop Time 1532    PT Time Calculation (min) 40 min    Activity Tolerance Patient tolerated treatment well    Behavior During Therapy [facility] for tasks assessed/performed             Past Medical History:  Diagnosis Date   Calculus of gallbladder with acute cholecystitis 03/16/2014   Dizziness and giddiness    Family history of adverse reaction to anesthesia    Sister would get sick   Rash of entire body since October 2015 03/16/2014    Past Surgical History:  Procedure Laterality Date   BREAST LUMPECTOMY     Bilateral   BUNIONECTOMY     CHOLECYSTECTOMY N/A 03/16/2014   Procedure: SINGLE SITE LAPAROSCOPIC CHOLECYSTECTOMY WITH INTRAOPERATIVE CHOLANGIOGRAM;  Surgeon: Michael Boston, MD;  Location: [facility];  Service: General;  Laterality: N/A;   ENDOSCOPIC RETROGRADE CHOLANGIOPANCREATOGRAPHY (ERCP) WITH PROPOFOL N/A 03/18/2014   Procedure: ENDOSCOPIC RETROGRADE CHOLANGIOPANCREATOGRAPHY (ERCP) WITH PROPOFOL;  Surgeon: Missy Sabins, MD;  Location: [facility];  Service: Endoscopy;  Laterality: N/A;   LAPAROSCOPIC CHOLECYSTECTOMY SINGLE SITE WITH INTRAOPERATIVE CHOLANGIOGRAM  03/16/2014    There were no vitals filed for this visit.   Subjective Assessment - 01/05/21 1454     Subjective Did buy a trekking pole this weekend.  Used in the house after performing VOR in sitting.  No significant issues; did not try outside.    Pertinent History No significant PMH    Limitations  Standing;Walking    Diagnostic tests MRI - no acute abnormalities    Patient Stated Goals To be more stable and be able to go for a walk; to not have anxiety about walking around in the community    Currently in Pain? No/denies             Balance Exercises - 01/05/21 1457       Balance Exercises: Standing   Rockerboard Anterior/posterior;Lateral    Rockerboard Limitations weight shifting ant/posterior with bilat UE support progressing to one UE support reaching up and looking up, alternating x 5 reps each side, progressing to looking down/reaching back <> reaching up/looking up with weight shift x 5 reps each side.  Also performed prolonged weight shift forwards and then backwards x 3 sets while performing 5 reps head turns each.  Then focused on maintaining balance in midline holding x 10 seconds x 2 sets; added in 10 reps head turns and 10 reps head nods with EO and then EC with min A for balance.  Changed to lateral weight shifting on rockerboard: performed weight shifting x 20 reps and then added in head turns to L and R with weight shift x 10 reps each direction; added in reaching to weight shift with head turn x 10 reps total, alternating sides.  Focused on maintaining balance while performing 10 reps head nods and head turns with EO.  With EC pt only able to tolerate 5 head  turns due to feeling dizzy.    Tandem Gait Forward;Retro;Intermittent upper extremity support;4 reps   solid surface   Retro Gait Upper extremity support;2 reps;Limitations    Retro Gait Limitations walking forwards and backwards with UE support on counter with EC; 2 times down and back with RUE touching counter and then 2x with LUE touching counter.  Not able to do more than 2 due to dizziness with EC.                PT Education - 01/05/21 1535     Education Details no changes to HEP today; try trekking pole outside this week if pt feels safe and balanced    Person(s) Educated Patient    Methods  Explanation    Comprehension Verbalized understanding              PT Short Term Goals - 01/04/21 1652       PT SHORT TERM GOAL #1   Title Pt will participate in further assessment of vestibular function with MSQ and SOT    Time 4    Period Weeks    Status Achieved    Target Date 01/02/21      PT SHORT TERM GOAL #2   Title Pt will participate in assessment of falls risk during gait with FGA    Time 4    Period Weeks    Status Achieved    Target Date 01/02/21      PT SHORT TERM GOAL #3   Title Pt will demonstrate resolution of R posterior canal cupulolithiasis    Time 4    Period Weeks    Status Deferred    Target Date 01/02/21      PT SHORT TERM GOAL #4   Title Pt will demonstrate independence with initial HEP    Time 4    Period Weeks    Status On-going    Target Date 01/02/21               PT Long Term Goals - 12/03/20 1437       PT LONG TERM GOAL #1   Title Pt will demonstrate independence with final balance and vestibular HEP    Time 8    Period Weeks    Status New    Target Date 02/01/21      PT LONG TERM GOAL #2   Title Pt will demonstrate improved VOR as indicated by 2 line difference on DVA    Baseline 3 lines; 7,4    Time 8    Period Weeks    Status New    Target Date 02/01/21      PT LONG TERM GOAL #3   Title Pt will demonstrate 4 point improvement on FGA to indicate decreased falls risk    Time 8    Period Weeks    Status New    Target Date 02/01/21      PT LONG TERM GOAL #4   Title Pt will demonstrate 8 point improvement on SOT    Time 8    Period Weeks    Status New    Target Date 02/01/21      PT LONG TERM GOAL #5   Title Pt will be able to negotiate 8 stairs and ambulate >1,000 outside over various terrain independently without sensation of instability.    Time 8    Period Weeks    Status New    Target Date 02/01/21  Plan - 01/05/21 1534     Clinical Impression Statement Patient is  tolerating recent changes to HEP so no change to HEP today.  Returned to rockerboard and continued to perform balance reaction training and postural control training with EO and EC and incorporated head turns and functional reaching with weight shifting.  Also continued to progress dynamic standing and walking balance.  Pt continues to have increased difficulty tolerating exercises with EC and reports greatest amount of dizziness when not able to use vision.  Will continue to address and progress towards LTG.    Personal Factors and Comorbidities Fitness;Past/Current Experience;Time since onset of injury/illness/exacerbation    Examination-Activity Limitations Locomotion Level;Stairs;Stand    Examination-Participation Restrictions Community Activity;Shop    Stability/Clinical Decision Making Stable/Uncomplicated    Rehab Potential Good    PT Frequency 2x / week    PT Duration 8 weeks    PT Treatment/Interventions ADLs/Self Care Home Management;Canalith Repostioning;Gait training;Stair training;Functional mobility training;Therapeutic activities;Therapeutic exercise;DME Instruction;Neuromuscular re-education;Balance training;Patient/family education;Vestibular    PT Next Visit Plan Did she walk with trekking poles?  try to progress VOR to standing, 30 seconds, UE support.  Rockerboard in more open area.  Balance with EC as pt can tolerate - maybe start with bed mobility/seated habituation with EC?  Defers treatment of R cupulo right now.    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 [place], [place], [address] Phone: [phone]   Fax:  [phone]  Name: ELLYN RUBIANO MRN: [id number] Date of Birth: 1955/05/10

## 2021-01-12 ENCOUNTER — Ambulatory Visit: Payer: 59 | Admitting: Physical Therapy

## 2021-01-15 ENCOUNTER — Ambulatory Visit: Payer: 59 | Admitting: Physical Therapy

## 2021-01-15 ENCOUNTER — Other Ambulatory Visit: Payer: Self-pay | Admitting: Neurology

## 2021-01-19 ENCOUNTER — Ambulatory Visit: Payer: 59 | Admitting: Physical Therapy

## 2021-01-19 ENCOUNTER — Encounter (HOSPITAL_COMMUNITY): Payer: Self-pay

## 2021-01-19 ENCOUNTER — Other Ambulatory Visit: Payer: Self-pay

## 2021-01-19 ENCOUNTER — Emergency Department (HOSPITAL_COMMUNITY)
Admission: EM | Admit: 2021-01-19 | Discharge: 2021-01-19 | Disposition: A | Discharge status: Home / Self Care | Payer: 59 | Attending: Emergency Medicine | Admitting: Emergency Medicine

## 2021-01-19 DIAGNOSIS — F410 Panic disorder [episodic paroxysmal anxiety] without agoraphobia: Secondary | ICD-10-CM | POA: Diagnosis not present

## 2021-01-19 DIAGNOSIS — Z79899 Other long term (current) drug therapy: Secondary | ICD-10-CM | POA: Diagnosis not present

## 2021-01-19 DIAGNOSIS — F419 Anxiety disorder, unspecified: Secondary | ICD-10-CM | POA: Diagnosis present

## 2021-01-19 LAB — COMPREHENSIVE METABOLIC PANEL
ALT: 24 U/L (ref 0–44)
AST: 23 U/L (ref 15–41)
Albumin: 4.6 g/dL (ref 3.5–5.0)
Alkaline Phosphatase: 52 U/L (ref 38–126)
Anion gap: 10 (ref 5–15)
BUN: 12 mg/dL (ref 8–23)
CO2: 19 mmol/L — ABNORMAL LOW (ref 22–32)
Calcium: 9.8 mg/dL (ref 8.9–10.3)
Chloride: 111 mmol/L (ref 98–111)
Creatinine, Ser: 0.86 mg/dL (ref 0.44–1.00)
GFR, Estimated: >60 mL/min (ref >60)
Glucose, Bld: 109 mg/dL — ABNORMAL HIGH (ref 70–99)
Potassium: 4 mmol/L (ref 3.5–5.1)
Sodium: 140 mmol/L (ref 135–145)
Total Bilirubin: 0.9 mg/dL (ref 0.3–1.2)
Total Protein: 7.5 g/dL (ref 6.5–8.1)

## 2021-01-19 LAB — CBC WITH DIFFERENTIAL/PLATELET
Abs Immature Granulocytes: 0.01 10*3/uL (ref 0.00–0.07)
Basophils Absolute: 0 10*3/uL (ref 0.0–0.1)
Basophils Relative: 1 %
Eosinophils Absolute: 0.1 10*3/uL (ref 0.0–0.5)
Eosinophils Relative: 1 %
HCT: 53.4 % — ABNORMAL HIGH (ref 36.0–46.0)
Hemoglobin: 17.5 g/dL — ABNORMAL HIGH (ref 12.0–15.0)
Immature Granulocytes: 0 %
Lymphocytes Relative: 18 %
Lymphs Abs: 1.1 10*3/uL (ref 0.7–4.0)
MCH: 30.2 pg (ref 26.0–34.0)
MCHC: 32.8 g/dL (ref 30.0–36.0)
MCV: 92.2 fL (ref 80.0–100.0)
Monocytes Absolute: 0.4 10*3/uL (ref 0.1–1.0)
Monocytes Relative: 6 %
Neutro Abs: 4.7 10*3/uL (ref 1.7–7.7)
Neutrophils Relative %: 74 %
Platelets: 307 10*3/uL (ref 150–400)
RBC: 5.79 MIL/uL — ABNORMAL HIGH (ref 3.87–5.11)
RDW: 14.1 % (ref 11.5–15.5)
WBC: 6.4 10*3/uL (ref 4.0–10.5)
nRBC: 0 % (ref 0.0–0.2)

## 2021-01-19 LAB — ETHANOL: Alcohol, Ethyl (B): <10 mg/dL (ref <10)

## 2021-01-19 MED ORDER — LORAZEPAM 0.5 MG PO TABS
0.5000 mg | ORAL_TABLET | Freq: Once | ORAL | Status: AC
  Administered 2021-01-19: 0.5 mg via ORAL
  Filled 2021-01-19: qty 1

## 2021-01-19 MED ORDER — LORAZEPAM 0.5 MG PO TABS: 0.5000 mg | ORAL_TABLET | Freq: Three times a day (TID) | ORAL | 0 refills | Status: DC | PRN

## 2021-01-19 MED ORDER — HYDROXYZINE HCL 25 MG PO TABS
ORAL_TABLET | ORAL | 1 refills | Status: DC
Start: 2021-01-19 — End: 2021-01-20

## 2021-01-19 MED ORDER — BUSPIRONE HCL 7.5 MG PO TABS: 7.5000 mg | ORAL_TABLET | Freq: Two times a day (BID) | ORAL | 0 refills | Status: AC

## 2021-01-19 NOTE — Discharge Instructions (Signed)
Start taking the buspirone for your anxiety.  The effective dose for this medication is often 20 to 30 mg/day.  You can increase the dose by 1 tablet every 2 days.  So in 2 days, increase to 2 in the morning and 1 in the evening.  2 days later, 2 in the am and 2 in the evening.  Follow up with a mental health provider for further treatment as we discussed

## 2021-01-19 NOTE — Telephone Encounter (Signed)
Hydroxyzine is an option , it  has a mildly sedating effect. I am Ok with that.

## 2021-01-19 NOTE — ED Provider Notes (Signed)
[facility] DEPT Provider Note   CSN: [id number] Arrival date & time: 01/19/21  1101     History Chief Complaint  Patient presents with  . Anxiety    Ann Hill is a 65 y.o. female.   Anxiety   Patient presented to the ER for evaluation of anxiety and panic attacks.  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  She has avoided seeing a doctor for prolonged period of time.  Patient recently started seeing a doctor and they noted that her red blood cell count is elevated.  This has caused her more stress and anxiety.  Patient also has had some issues with dizziness especially when she is outside.  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  Patient is undergoing therapy for that but her neurologist also recommended seeing a therapist regarding her anxiety.  Patient has not been able to get an appointment yet.  Her symptoms have been worsening because she has increasing anxiety associated with the fear of probable cancer associated with this erythrocytosis that was noted on her blood test.  Patient had a severe episode of panic and anxiety today that brought her to the ED.  Her primary doctor had prescribed her Zoloft but she was not interested in taking that medication.  Prior to my evaluation the patient was given a dose of Ativan and she is feeling much better right now.  Past Medical History:  Diagnosis Date  . Calculus of gallbladder with acute cholecystitis 03/16/2014  . Dizziness and giddiness   . Family history of adverse reaction to anesthesia    Sister would get sick  . Rash of entire body since October 2015 03/16/2014    Patient Active Problem List   Diagnosis Date Noted  . Gait instability 11/20/2020  . Nystagmus, end-position 11/20/2020  . Snoring 11/20/2020  . 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 Dizziness, nonspecific 11/20/2020  . Acute calculous cholecystitis s/p lap  chole w [facility] 03/16/2014 03/16/2014  . Common bile duct (CBD) obstruction, probable stone 03/16/2014  . Rash of entire body since October 2015 03/16/2014    Past Surgical History:  Procedure Laterality Date  . BREAST LUMPECTOMY     Bilateral  . BUNIONECTOMY    . CHOLECYSTECTOMY N/A 03/16/2014   Procedure: SINGLE SITE LAPAROSCOPIC CHOLECYSTECTOMY WITH INTRAOPERATIVE CHOLANGIOGRAM;  Surgeon: Michael Boston, MD;  Location: [facility] ORS;  Service: General;  Laterality: N/A;  . ENDOSCOPIC RETROGRADE CHOLANGIOPANCREATOGRAPHY (ERCP) WITH PROPOFOL N/A 03/18/2014   Procedure: ENDOSCOPIC RETROGRADE CHOLANGIOPANCREATOGRAPHY (ERCP) WITH PROPOFOL;  Surgeon: Missy Sabins, MD;  Location: [facility] ENDOSCOPY;  Service: Endoscopy;  Laterality: N/A;  . LAPAROSCOPIC CHOLECYSTECTOMY SINGLE SITE WITH INTRAOPERATIVE CHOLANGIOGRAM  03/16/2014     OB History   No obstetric history on file.     Family History  Problem Relation Age of Onset  . Hypertension Mother   . COPD Mother   . Cataracts Father   . Cancer Sister        brain(tumor)  . Heart disease Brother   . Lymphoma Brother     Social History   Tobacco Use  . Smoking status: Never  . Smokeless tobacco: Never    Home Medications Prior to Admission medications   Medication Sig Start Date End Date Taking? Authorizing Provider  busPIRone (BUSPAR) 7.5 MG tablet Take 1 tablet (7.5 mg total) by mouth 2 (two) times daily. 01/19/21 02/18/21 Yes Dorie Rank, MD  LORazepam Francee Gentile)  0.5 MG tablet Take 1 tablet (0.5 mg total) by mouth every 8 (eight) hours as needed for anxiety. 01/19/21  Yes Dorie Rank, MD  CALCIUM PO Take 1 tablet by mouth 3 (three) times daily.    [provider]  sertraline (ZOLOFT) 25 MG tablet TAKE 1 TABLET (25 MG TOTAL) BY MOUTH DAILY. 01/15/21   Dohmeier, Asencion Partridge, MD  diphenhydrAMINE (BENADRYL) 25 MG tablet Take 25 mg by mouth every 6 (six) hours as needed.  06/01/20  [provider]    Allergies    Morphine and Morphine and  related  Review of Systems   Review of Systems  All other systems reviewed and are negative.  Physical Exam Updated Vital Signs BP (!) 181/120 (BP Location: Right Arm)   Pulse (!) 103   Temp 98.3 F (36.8 C) (Oral)   Resp 18   SpO2 94%   Physical Exam Vitals and nursing note reviewed.  Constitutional:      General: She is not in acute distress.    Appearance: She is well-developed.  HENT:     Head: Normocephalic and atraumatic.     Right Ear: External ear normal.     Left Ear: External ear normal.  Eyes:     General: No scleral icterus.       Right eye: No discharge.        Left eye: No discharge.     Conjunctiva/sclera: Conjunctivae normal.  Neck:     Trachea: No tracheal deviation.  Cardiovascular:     Rate and Rhythm: Normal rate and regular rhythm.  Pulmonary:     Effort: Pulmonary effort is normal. No respiratory distress.     Breath sounds: Normal breath sounds. No stridor. No wheezing or rales.  Abdominal:     General: Bowel sounds are normal. There is no distension.     Palpations: Abdomen is soft.     Tenderness: There is no abdominal tenderness. There is no guarding or rebound.  Musculoskeletal:        General: No tenderness or deformity.     Cervical back: Neck supple.  Skin:    General: Skin is warm and dry.     Findings: No rash.  Neurological:     General: No focal deficit present.     Mental Status: She is alert.     Cranial Nerves: No cranial nerve deficit (no facial droop, extraocular movements intact, no slurred speech).     Sensory: No sensory deficit.     Motor: No abnormal muscle tone or seizure activity.     Coordination: Coordination normal.  Psychiatric:        Mood and Affect: Mood normal.    ED Results / Procedures / Treatments   Labs (all labs ordered are listed, but only abnormal results are displayed) Labs Reviewed  COMPREHENSIVE METABOLIC PANEL - Abnormal; Notable for the following components:      Result Value   CO2 19 (*)     Glucose, Bld 109 (*)    All other components within normal limits  CBC WITH DIFFERENTIAL/PLATELET - Abnormal; Notable for the following components:   RBC 5.79 (*)    Hemoglobin 17.5 (*)    HCT 53.4 (*)    All other components within normal limits  ETHANOL  RAPID URINE DRUG SCREEN, HOSP PERFORMED    EKG None  Radiology No results found.  Procedures Procedures   Medications Ordered in ED Medications  LORazepam (ATIVAN) tablet 0.5 mg (0.5 mg Oral Given  01/19/21 1140)    ED Course  I have reviewed the triage vital signs and the nursing notes.  Pertinent labs & imaging results that were available during my care of the patient were reviewed by me and considered in my medical decision making (see chart for details).    MDM Rules/Calculators/A&P                           Patient's labs are notable for her erythrocytosis.  This is no known and she is receiving outpatient follow-up regarding that.  Patient is having recurrent issues with anxiety.  Discussed the importance of a daily medication to help prevent her attacks.  We can supplement with Ativan but this would not be the best sole medication for her.  Patient is agreeable to this at this point.  Recommend outpatient follow-up with a behavioral health urgent care center if she is unable to find a psychiatrist in a timely fashion. Final Clinical Impression(s) / ED Diagnoses Final diagnoses:  Anxiety attack    Rx / DC Orders ED Discharge Orders          Ordered    LORazepam (ATIVAN) 0.5 MG tablet  Every 8 hours PRN        01/19/21 1445    busPIRone (BUSPAR) 7.5 MG tablet  2 times daily        01/19/21 1445             Dorie Rank, MD 01/21/21 1003

## 2021-01-19 NOTE — ED Provider Notes (Signed)
Emergency Medicine Provider Triage Evaluation Note  Ann Hill , a 65 y.o. female  was evaluated in triage.  Pt complains of severe anxiety and panic attacks. Pt reports she typically has a fear of getting cancer/dying however she is currently in the process of being seen by hematology - appoint scheduled on 12/14 for elevated RBCs. Pt is concerned she could have cancer and feels extremely anxious. She is tearful in triage and hyperventilating. NO SI, HI, or AVH. Here with partner.   Review of Systems  Positive: + anxiety Negative: - pain, SI, HI, AVH  Physical Exam  BP (!) 181/120 (BP Location: Right Arm)   Pulse (!) 103   Temp 98.3 F (36.8 C) (Oral)   Resp 18   SpO2 94%  Gen:   Awake, no distress   Resp:  Normal effort  MSK:   Moves extremities without difficulty  Other:  Tearful. Hyperventilating.   Medical Decision Making  Medically screening exam initiated at 11:36 AM.  Appropriate orders placed.  Ann Hill was informed that the remainder of the evaluation will be completed by another provider, this initial triage assessment does not replace that evaluation, and the importance of remaining in the ED until their evaluation is complete.     Eustaquio Maize, PA-C 01/19/21 1137    Dorie Rank, MD 01/21/21 1010

## 2021-01-19 NOTE — Addendum Note (Signed)
Addended by: Larey Seat on: 01/19/2021 06:37 PM   Modules accepted: Orders

## 2021-01-19 NOTE — ED Triage Notes (Signed)
Pt reports having multiple panic attacks over the past few days. She reports struggling with a phobia of disease and has been trying to self-diagnose herself with cancer. She is tearful in triage. Denies taking anything for anxiety.

## 2021-01-20 ENCOUNTER — Other Ambulatory Visit: Payer: Self-pay | Admitting: Neurology

## 2021-01-20 MED ORDER — HYDROXYZINE HCL 25 MG PO TABS: 25.0000 mg | ORAL_TABLET | Freq: Three times a day (TID) | ORAL | 1 refills | Status: DC | PRN

## 2021-01-22 ENCOUNTER — Ambulatory Visit: Payer: 59 | Admitting: Physical Therapy

## 2021-01-22 NOTE — Telephone Encounter (Signed)
I ordered the medication she requested.

## 2021-01-26 ENCOUNTER — Ambulatory Visit: Payer: 59 | Admitting: Physical Therapy

## 2021-01-29 ENCOUNTER — Ambulatory Visit: Payer: 59 | Admitting: Physical Therapy

## 2021-02-02 ENCOUNTER — Ambulatory Visit: Payer: 59 | Admitting: Physical Therapy

## 2021-02-05 ENCOUNTER — Ambulatory Visit: Payer: 59 | Admitting: Physical Therapy

## 2021-02-12 ENCOUNTER — Ambulatory Visit: Payer: 59 | Admitting: Physical Therapy

## 2021-02-12 ENCOUNTER — Other Ambulatory Visit: Payer: Self-pay | Admitting: Neurology

## 2021-02-18 ENCOUNTER — Encounter: Payer: Self-pay | Admitting: Neurology

## 2021-02-18 NOTE — Telephone Encounter (Signed)
There is no specialized ENT physician for vestibulitis in this town.  Ann Hill is the best choice for therapy of vertigo.

## 2021-04-23 ENCOUNTER — Encounter: Payer: Self-pay | Admitting: Neurology

## 2021-07-02 ENCOUNTER — Ambulatory Visit: Payer: 59 | Admitting: Podiatry

## 2021-07-02 ENCOUNTER — Ambulatory Visit (INDEPENDENT_AMBULATORY_CARE_PROVIDER_SITE_OTHER): Payer: 59

## 2021-07-02 ENCOUNTER — Encounter: Payer: Self-pay | Admitting: Podiatry

## 2021-07-02 DIAGNOSIS — M21611 Bunion of right foot: Secondary | ICD-10-CM

## 2021-07-02 DIAGNOSIS — L600 Ingrowing nail: Secondary | ICD-10-CM

## 2021-07-02 DIAGNOSIS — M21612 Bunion of left foot: Secondary | ICD-10-CM

## 2021-07-02 DIAGNOSIS — M7751 Other enthesopathy of right foot: Secondary | ICD-10-CM | POA: Diagnosis not present

## 2021-07-02 DIAGNOSIS — M21619 Bunion of unspecified foot: Secondary | ICD-10-CM | POA: Diagnosis not present

## 2021-07-02 NOTE — Progress Notes (Signed)
Subjective:   Patient ID: Ann Hill, female   DOB: 66 y.o.   MRN: [id number]   HPI Patient presents stating she has had severe bunion deformity right and had surgery twice in the 80s and had bunion also left which is bothering her not to the same degree and she has a lot of problems with her toenail on the right big toe is the big toe is also lifted and has inflammation of her forefoot right that has become increasingly sore over the years.  Patient does have callus formation also and just had generalized foot pain right over left does not smoke likes to be active   Review of Systems  All other systems reviewed and are negative.      Objective:  Physical Exam Vitals and nursing note reviewed.  Constitutional:      Appearance: She is well-developed.  Pulmonary:     Effort: Pulmonary effort is normal.  Musculoskeletal:        General: Normal range of motion.  Skin:    General: Skin is warm.  Neurological:     Mental Status: She is alert.    Neurovascular status intact muscle strength found to be adequate range of motion within normal limits.  Patient is found to have severe structural deformity right first metatarsal over left with elevation of the right hallux and chronic nail disease secondary to the pressure of the right hallux and it is digging into her distal skin with irritation.  Also has exquisite discomfort second metatarsal phalangeal joint right moderate on the left and has callus formation bilateral.  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  At this point I do think most likely first MPJ fusion will be necessary to stabilize the first metatarsal and hallux and  possibility and probability for shortening osteotomy second and third.  Today we will get a focus on the nail and the chronic capsulitis and see if we can make improvement and I went ahead today I did a forefoot block right I then aspirated the second MPJ getting out a small amount of Hollinger fluid injected quarter cc dexamethasone Kenalog into the second MPJ applied thick pad discussed removal of the nail and allowed her to read consent form for permanent removal of the hallux nail right.  She wants procedure signed consent form and today I infiltrated the right hallux 60 mg like Marcaine mixture sterile prep done using sterile instrumentation I removed the right hallux nail and exposed matrix applied phenol for applications 30 seconds followed by alcohol sterile dressing gave instructions on soaks and reappoint for [place] to recheck again in the next 4 weeks and we will have to consider surgical intervention in this case  X-rays indicate significant elevation intermetatarsal angle right over left with previous surgery with probability for opening osteotomy of the right first metatarsal base with failure to correct the intermetatarsal angle right over left with elongated second and third metatarsals right over left

## 2021-07-02 NOTE — Patient Instructions (Signed)

## 2021-07-03 ENCOUNTER — Encounter: Payer: Self-pay | Admitting: Podiatry

## 2021-07-03 ENCOUNTER — Telehealth: Payer: Self-pay | Admitting: *Deleted

## 2021-07-03 NOTE — Telephone Encounter (Signed)
Spoke with patient giving information per physician, can't remove the bandage, is stuck to her toe ,is unable to soak today,is staying at hospital, will have to wait until tomorrow to start her soaks.

## 2021-07-03 NOTE — Telephone Encounter (Signed)
Wife is at hospital in ICU after he had a stroke, unable to leave.  She will ask the nurse if they have any epsom salts there but if not, will it be ok to leave the bandage on for another day before starting the soaks?Please advise.

## 2021-07-03 NOTE — Telephone Encounter (Signed)
I told her to take it off and put on a bandaid

## 2021-07-06 NOTE — Telephone Encounter (Signed)
Please advise 

## 2021-07-06 NOTE — Telephone Encounter (Signed)
Can you please contact this patient and set up a follow-up appointment with Dr. Paulla Dolly just before June 3.  Thanks, Dr. Amalia Hailey

## 2021-07-07 NOTE — Telephone Encounter (Signed)
Please call patient to schedule w/ Dr Paulla Dolly before 07/18/21,thanks

## 2021-07-07 NOTE — Telephone Encounter (Signed)
Patient has been scheduled

## 2021-07-15 ENCOUNTER — Encounter: Payer: Self-pay | Admitting: Podiatry

## 2021-07-15 ENCOUNTER — Ambulatory Visit: Payer: 59 | Admitting: Podiatry

## 2021-07-15 DIAGNOSIS — L600 Ingrowing nail: Secondary | ICD-10-CM | POA: Diagnosis not present

## 2021-07-15 DIAGNOSIS — M21619 Bunion of unspecified foot: Secondary | ICD-10-CM

## 2021-07-15 DIAGNOSIS — M7751 Other enthesopathy of right foot: Secondary | ICD-10-CM

## 2021-07-15 NOTE — Progress Notes (Signed)
Subjective:   Patient ID: Ann Hill, female   DOB: 66 y.o.   MRN: [id number]   HPI Patient presents stating she was concerned about her right big toenail that was removed there is still some tenderness and also questions about her bunion   ROS      Objective:  Physical Exam  Neurovascular status intact with crusted tissue on the right hallux there may be a very small bone spur distally because of the structure of the big toe and I did apply padding to reduce pressure.  Does have significant structural bunion deformity significant digital deformity     Assessment:  Severe structural bunion deformity long-term along with nail removed right that is healing well crusted over no drainage     Plan:  Advised on being a little careful with the nail but it should continue to heal uneventfully and applied cushioning to it and discussed bunion understanding at 1 point surgery will be necessary when the timing is right and probably will require fusion

## 2021-08-06 ENCOUNTER — Ambulatory Visit: Payer: 59 | Admitting: Podiatry

## 2022-07-19 ENCOUNTER — Ambulatory Visit: Payer: 59 | Admitting: Podiatry

## 2022-07-19 ENCOUNTER — Encounter: Payer: Self-pay | Admitting: Podiatry

## 2022-07-19 DIAGNOSIS — L600 Ingrowing nail: Secondary | ICD-10-CM

## 2022-07-19 NOTE — Patient Instructions (Signed)

## 2022-07-21 NOTE — Progress Notes (Signed)
Subjective:   Patient ID: Ann Hill, female   DOB: 67 y.o.   MRN: [id number]   HPI Patient presents with severely thickened dystrophic right big toenail that is painful and makes shoe gear difficult.  States that she had had a removal once before along the left which is done well but unfortunately it came back   ROS      Objective:  Physical Exam  Neurovascular status intact with very thin tight nailbed right big toe that is irritating the underlying tissue and is sore and hard to wear shoe gear where     Assessment:  Formation of ingrown toenail deformity right hallux that is painful when pressed     Plan:  H&P reviewed recommended removal of the nail again completely explaining procedure risk and patient wants surgery.  Understands risk and no guarantee that this will come back for that it will not come back.  Patient read signed consent form I infiltrated the right big toe 60 mg like Marcaine mixture sterile prep done using sterile instrumentation remove the hallux nail exposed matrix applied phenol for applications 30 seconds followed by alcohol by sterile dressing gave instructions on soaks leave dressing on 24 hours take it off earlier if throbbing were to occur and reappoint to recheck

## 2022-07-22 ENCOUNTER — Encounter: Payer: Self-pay | Admitting: Podiatry

## 2022-08-17 ENCOUNTER — Encounter: Payer: Self-pay | Admitting: Podiatry

## 2023-09-19 ENCOUNTER — Other Ambulatory Visit: Payer: Self-pay | Admitting: Family Medicine

## 2023-09-19 DIAGNOSIS — I2510 Atherosclerotic heart disease of native coronary artery without angina pectoris: Secondary | ICD-10-CM

## 2023-09-19 NOTE — Progress Notes (Signed)
 Chronic HLD. Unclear fmhx.  CAC ordered.

## 2023-10-07 ENCOUNTER — Ambulatory Visit (HOSPITAL_BASED_OUTPATIENT_CLINIC_OR_DEPARTMENT_OTHER)
Admission: RE | Admit: 2023-10-07 | Discharge: 2023-10-07 | Disposition: A | Discharge status: Home / Self Care | Payer: Self-pay | Source: Ambulatory Visit | Attending: Family Medicine | Admitting: Family Medicine

## 2023-10-07 DIAGNOSIS — I2510 Atherosclerotic heart disease of native coronary artery without angina pectoris: Secondary | ICD-10-CM | POA: Insufficient documentation
